# Patient Record
Sex: Female | Born: 1940 | Race: White | Hispanic: No | Marital: Married | Smoking: Never smoker
Health system: Southern US, Community
[De-identification: ages and names within clinical notes are randomized; demographics above are authoritative.]

## PROBLEM LIST (undated history)

## (undated) DIAGNOSIS — J45909 Unspecified asthma, uncomplicated: Secondary | ICD-10-CM

## (undated) HISTORY — DX: Unspecified asthma, uncomplicated: J45.909

## (undated) HISTORY — PX: OTHER SURGICAL HISTORY: SHX169

## (undated) HISTORY — PX: APPENDECTOMY: SHX54

## (undated) HISTORY — PX: MASTECTOMY: SHX3

---

## 1999-11-08 ENCOUNTER — Ambulatory Visit (HOSPITAL_COMMUNITY): Admission: RE | Admit: 1999-11-08 | Discharge: 1999-11-08 | Payer: Self-pay | Admitting: Gastroenterology

## 1999-11-14 ENCOUNTER — Other Ambulatory Visit: Admission: RE | Admit: 1999-11-14 | Discharge: 1999-11-14 | Payer: Self-pay | Admitting: General Surgery

## 1999-12-23 ENCOUNTER — Encounter: Payer: Self-pay | Admitting: General Surgery

## 1999-12-26 ENCOUNTER — Ambulatory Visit (HOSPITAL_COMMUNITY): Admission: RE | Admit: 1999-12-26 | Discharge: 1999-12-26 | Payer: Self-pay | Admitting: General Surgery

## 1999-12-26 ENCOUNTER — Encounter (INDEPENDENT_AMBULATORY_CARE_PROVIDER_SITE_OTHER): Payer: Self-pay | Admitting: Specialist

## 2001-11-02 ENCOUNTER — Other Ambulatory Visit: Admission: RE | Admit: 2001-11-02 | Discharge: 2001-11-02 | Payer: Self-pay | Admitting: Obstetrics and Gynecology

## 2007-04-22 ENCOUNTER — Ambulatory Visit: Payer: Self-pay | Admitting: Pulmonary Disease

## 2007-05-24 ENCOUNTER — Ambulatory Visit: Payer: Self-pay | Admitting: Cardiology

## 2007-06-01 ENCOUNTER — Ambulatory Visit: Payer: Self-pay | Admitting: Pulmonary Disease

## 2007-10-19 DIAGNOSIS — E785 Hyperlipidemia, unspecified: Secondary | ICD-10-CM

## 2007-10-19 DIAGNOSIS — C439 Malignant melanoma of skin, unspecified: Secondary | ICD-10-CM | POA: Insufficient documentation

## 2007-10-19 DIAGNOSIS — C50919 Malignant neoplasm of unspecified site of unspecified female breast: Secondary | ICD-10-CM | POA: Insufficient documentation

## 2007-10-20 ENCOUNTER — Ambulatory Visit: Payer: Self-pay | Admitting: Pulmonary Disease

## 2007-10-20 DIAGNOSIS — R930 Abnormal findings on diagnostic imaging of skull and head, not elsewhere classified: Secondary | ICD-10-CM

## 2007-10-20 DIAGNOSIS — J479 Bronchiectasis, uncomplicated: Secondary | ICD-10-CM

## 2007-10-20 DIAGNOSIS — J189 Pneumonia, unspecified organism: Secondary | ICD-10-CM

## 2011-02-18 NOTE — Assessment & Plan Note (Signed)
[facility]                             PULMONARY OFFICE NOTE   NAME:COVINGTONGenesia, Caslin                    MRN:          [id number]  DATE:04/22/2007                            DOB:          01/29/1942    PULMONARY CONSULTATION:  I met Ms. Odell today for evaluation of her  abnormal chest x-ray.  She says that on June 23 she developed flu-like  symptoms with chills and a sharp pain on the left side.  She had a blood  test done and was found to have an increase in her white blood cell  count.  As a result she was started on a Z-Pak but had persistent  symptoms.  She was then evaluated again with a chest x-ray on June 30  and was given a course of Biaxin for 10 days.  She says that since then  her symptoms have improved and she is no longer having any left-sided  chest pain.  Her chest x-ray from June 30 showed an infiltrate in the  left lower lobe as well as atelectasis in the left mid lung field and a  faint infiltrate on the right as well.  She had undergone CT scan of her  chest on June 30 as well and this showed again an infiltrate in the left  lower lobe as well as the left lingular lobe with atelectasis and a  faint infiltrate on the right as well as small nodular densities in the  bronchovascular distribution, more prominent in the lower lobes.  She  then had a follow-up chest x-ray done on July 8, which showed  significant improvement in her infiltrate although she still had some  atelectasis.  She says that she apparently does have an occasional  cough, which is dry.  She is not having any fevers, chills, sweats or  weight loss.  She denies any chest pain, chest tightness or wheezing.  She also denies any abdominal pain, nausea, vomiting, or diarrhea.  She  has not had any skin rashes or leg swelling.  She has not had any sick  exposures.  There is no significant travel history.  She owns 3 cats but  does not have any other significant  exposure to farm animals or birds.  There is no history of occupational exposures.  She does have a history  of previous breast cancer in 1987 as well as melanoma several years ago  without any evidence of recurrence since those times.   Her past medical history is significant for:  1. Asthma.  2. Elevated cholesterol.  3. Breast cancer.  4. Allergies.  5. Melanoma.   Past surgical history is significant for:  1. Tubal ligation.  2. Hysterectomy.  3. Bilateral mastectomy.  4. Appendectomy.   Her current medications are:  1. Zocor 20 mg daily.  2. Vagifem 50 mg once a week.   She has no known drug allergies.   SOCIAL HISTORY:  She is married.  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  She has no personal  history of smoking but  has significant second-hand tobacco exposure.  She denies alcohol use.   Her family history is significant for her sister, who had cancer.   On review of systems, she says she has lost approximately 5 pounds over  the last 6 months.   On physical exam, she is 5 feet 4 inches tall and 131 pounds.  Temperature is 98.8, blood pressure is 140/90, heart rate is 72, oxygen  saturation 99% on room air.  HEENT:  Pupils are reactive.  Extraocular muscles are intact.  There is  no sinus tenderness, no nasal discharge, no oral lesions.  No  lymphadenopathy, no thyromegaly.  Heart was S1-S2 with an occasional extra beat.  There are no murmurs.  CHEST:  There are faint crackles heard at the bases, more prominent on  the left, but there was no wheezing.  Abdomen was thin, soft, nontender.  Positive bowel sounds.  EXTREMITIES:  There was no edema, cyanosis or clubbing.  NEUROLOGIC:  No focal deficits were appreciated.   Chest x-ray shows atelectasis of the left lingular region, which appears  to have improved slightly compared to her x-ray from June 8 with  decreased appearance in the infiltrates.   IMPRESSION:  Possible  pneumonia with persistent infiltrate and faint  nodular densities on her CT scan of the chest from April 05, 2007.  She  currently has improved quite a bit symptomatically with her course of  antibiotics.  What I would like for her to is monitor her symptom status  and we will arrange for her to undergo a repeat CT scan of her chest in  approximately 4-6 weeks, at which time if there are persistent changes,  then she may need to undergo bronchoscopy with airway inspection and  tissue sampling.   I will follow up with her after I have a chance to review her chest CT.     Coralyn Helling, MD  Electronically Signed    VS/MedQ  DD: 04/22/2007  DT: 04/23/2007  Job #: [id number]   cc:   Kerry Kass, M.D. [facility]

## 2011-02-18 NOTE — Assessment & Plan Note (Signed)
[facility]                             PULMONARY OFFICE NOTE   NAME:COVINGTONGlorya, Bartley                    MRN:          [id number]  DATE:06/01/2007                            DOB:          01/29/1942    I saw Ms. Rhinesmith today for followup of her abnormal CT scan of the  chest with recent pneumonia.   She had undergone a repeat CT scan of her chest on May 24, 2007, and  this showed persistence of the bilateral diffuse nodularity, but without  significant change.  She also had right upper lobe, right middle lobe,  and lingular lobe bronchiectasis, but her left lower lobe infiltrate had  improved.  She currently denies any symptoms of coughing, sputum  production, or hemoptysis.  She also currently denies any symptoms of  fevers, sweats, chills, or weight loss.  She does not have any problems  with chest pains, nausea, vomiting, or diarrhea or abdominal pain.  She  has not had any recent skin rashes.  She also denies any sinus symptoms.  Her medication list was reviewed.   PHYSICAL EXAMINATION:  VITAL SIGNS:  She weighs 132 pounds, temperature  is 98.2, blood pressure is 138/86, heart rate is 70, oxygen saturation  is 97% on room air.  HEENT:  There is no sinus tenderness, no nasal discharge, no oral  lesions.  NECK:  No lymphadenopathy.  HEART:  S1, S2.  CHEST:  She had a prolonged expiratory phase, but no wheezing or rales.  ABDOMEN:  Soft, nontender.  EXTREMITIES:  There was no edema.   IMPRESSION:  1. Recent pneumonia, which has improved to both radiographically as      well as symptomatically.  She also has the nodularity on her CT.      Given the fact that she has improved so much symptomatically, I do      not think that I would recommend any further interventions at this      time.  What I would recommend is that she undergo repeat chest x-      ray in approximately 4 months to determine if any further      interventions would be  necessary then.  2. Bronchiectasis.  Again, she is not having much with regard to      symptoms with this, and I would monitor her clinically, and I will      reassess this at her next follow-up visit, at which time also I      would determine if it would be beneficial to have her undergo      pulmonary function tests to see if she may benefit from the use of      inhaler therapy as well.   I will follow up with her in approximately 4 months.    Coralyn Helling, MD  Electronically Signed   VS/MedQ  DD: 06/01/2007  DT: 06/02/2007  Job #: [id number]   cc:   Kerry Kass, M.D. [facility]

## 2012-01-09 DIAGNOSIS — J019 Acute sinusitis, unspecified: Secondary | ICD-10-CM | POA: Diagnosis not present

## 2012-01-09 DIAGNOSIS — J45909 Unspecified asthma, uncomplicated: Secondary | ICD-10-CM | POA: Diagnosis not present

## 2012-01-19 DIAGNOSIS — H04129 Dry eye syndrome of unspecified lacrimal gland: Secondary | ICD-10-CM | POA: Diagnosis not present

## 2012-01-28 DIAGNOSIS — Z85820 Personal history of malignant melanoma of skin: Secondary | ICD-10-CM | POA: Diagnosis not present

## 2012-01-28 DIAGNOSIS — D1801 Hemangioma of skin and subcutaneous tissue: Secondary | ICD-10-CM | POA: Diagnosis not present

## 2012-01-28 DIAGNOSIS — L820 Inflamed seborrheic keratosis: Secondary | ICD-10-CM | POA: Diagnosis not present

## 2012-01-28 DIAGNOSIS — D485 Neoplasm of uncertain behavior of skin: Secondary | ICD-10-CM | POA: Diagnosis not present

## 2012-01-28 DIAGNOSIS — L821 Other seborrheic keratosis: Secondary | ICD-10-CM | POA: Diagnosis not present

## 2012-01-29 DIAGNOSIS — J45909 Unspecified asthma, uncomplicated: Secondary | ICD-10-CM | POA: Diagnosis not present

## 2012-02-25 DIAGNOSIS — E782 Mixed hyperlipidemia: Secondary | ICD-10-CM | POA: Diagnosis not present

## 2012-02-25 DIAGNOSIS — Z79899 Other long term (current) drug therapy: Secondary | ICD-10-CM | POA: Diagnosis not present

## 2012-02-26 DIAGNOSIS — J45909 Unspecified asthma, uncomplicated: Secondary | ICD-10-CM | POA: Diagnosis not present

## 2012-03-03 DIAGNOSIS — N952 Postmenopausal atrophic vaginitis: Secondary | ICD-10-CM | POA: Diagnosis not present

## 2012-03-03 DIAGNOSIS — I10 Essential (primary) hypertension: Secondary | ICD-10-CM | POA: Diagnosis not present

## 2012-03-03 DIAGNOSIS — E782 Mixed hyperlipidemia: Secondary | ICD-10-CM | POA: Diagnosis not present

## 2012-03-17 DIAGNOSIS — M545 Low back pain: Secondary | ICD-10-CM | POA: Diagnosis not present

## 2012-03-23 DIAGNOSIS — M9981 Other biomechanical lesions of cervical region: Secondary | ICD-10-CM | POA: Diagnosis not present

## 2012-03-23 DIAGNOSIS — M999 Biomechanical lesion, unspecified: Secondary | ICD-10-CM | POA: Diagnosis not present

## 2012-03-29 DIAGNOSIS — M999 Biomechanical lesion, unspecified: Secondary | ICD-10-CM | POA: Diagnosis not present

## 2012-03-29 DIAGNOSIS — M9981 Other biomechanical lesions of cervical region: Secondary | ICD-10-CM | POA: Diagnosis not present

## 2012-03-31 DIAGNOSIS — M545 Low back pain, unspecified: Secondary | ICD-10-CM | POA: Diagnosis not present

## 2012-03-31 DIAGNOSIS — Z853 Personal history of malignant neoplasm of breast: Secondary | ICD-10-CM | POA: Diagnosis not present

## 2012-05-26 DIAGNOSIS — Z79899 Other long term (current) drug therapy: Secondary | ICD-10-CM | POA: Diagnosis not present

## 2012-05-27 DIAGNOSIS — J45909 Unspecified asthma, uncomplicated: Secondary | ICD-10-CM | POA: Diagnosis not present

## 2012-06-02 DIAGNOSIS — E782 Mixed hyperlipidemia: Secondary | ICD-10-CM | POA: Diagnosis not present

## 2012-07-07 DIAGNOSIS — Z23 Encounter for immunization: Secondary | ICD-10-CM | POA: Diagnosis not present

## 2012-07-19 DIAGNOSIS — J45901 Unspecified asthma with (acute) exacerbation: Secondary | ICD-10-CM | POA: Diagnosis not present

## 2012-07-27 DIAGNOSIS — N3090 Cystitis, unspecified without hematuria: Secondary | ICD-10-CM | POA: Diagnosis not present

## 2012-08-13 DIAGNOSIS — M9981 Other biomechanical lesions of cervical region: Secondary | ICD-10-CM | POA: Diagnosis not present

## 2012-08-13 DIAGNOSIS — M999 Biomechanical lesion, unspecified: Secondary | ICD-10-CM | POA: Diagnosis not present

## 2012-08-26 DIAGNOSIS — M9981 Other biomechanical lesions of cervical region: Secondary | ICD-10-CM | POA: Diagnosis not present

## 2012-08-26 DIAGNOSIS — M5126 Other intervertebral disc displacement, lumbar region: Secondary | ICD-10-CM | POA: Diagnosis not present

## 2012-08-26 DIAGNOSIS — M999 Biomechanical lesion, unspecified: Secondary | ICD-10-CM | POA: Diagnosis not present

## 2012-08-31 DIAGNOSIS — M9981 Other biomechanical lesions of cervical region: Secondary | ICD-10-CM | POA: Diagnosis not present

## 2012-08-31 DIAGNOSIS — M999 Biomechanical lesion, unspecified: Secondary | ICD-10-CM | POA: Diagnosis not present

## 2012-08-31 DIAGNOSIS — M5126 Other intervertebral disc displacement, lumbar region: Secondary | ICD-10-CM | POA: Diagnosis not present

## 2012-09-07 DIAGNOSIS — M9981 Other biomechanical lesions of cervical region: Secondary | ICD-10-CM | POA: Diagnosis not present

## 2012-09-07 DIAGNOSIS — M5126 Other intervertebral disc displacement, lumbar region: Secondary | ICD-10-CM | POA: Diagnosis not present

## 2012-09-07 DIAGNOSIS — M999 Biomechanical lesion, unspecified: Secondary | ICD-10-CM | POA: Diagnosis not present

## 2012-09-09 DIAGNOSIS — M9981 Other biomechanical lesions of cervical region: Secondary | ICD-10-CM | POA: Diagnosis not present

## 2012-09-09 DIAGNOSIS — M5126 Other intervertebral disc displacement, lumbar region: Secondary | ICD-10-CM | POA: Diagnosis not present

## 2012-09-09 DIAGNOSIS — M999 Biomechanical lesion, unspecified: Secondary | ICD-10-CM | POA: Diagnosis not present

## 2012-09-14 DIAGNOSIS — M9981 Other biomechanical lesions of cervical region: Secondary | ICD-10-CM | POA: Diagnosis not present

## 2012-09-14 DIAGNOSIS — M5126 Other intervertebral disc displacement, lumbar region: Secondary | ICD-10-CM | POA: Diagnosis not present

## 2012-09-14 DIAGNOSIS — M999 Biomechanical lesion, unspecified: Secondary | ICD-10-CM | POA: Diagnosis not present

## 2012-12-02 DIAGNOSIS — J45909 Unspecified asthma, uncomplicated: Secondary | ICD-10-CM | POA: Diagnosis not present

## 2012-12-22 DIAGNOSIS — Z79899 Other long term (current) drug therapy: Secondary | ICD-10-CM | POA: Diagnosis not present

## 2012-12-22 DIAGNOSIS — E782 Mixed hyperlipidemia: Secondary | ICD-10-CM | POA: Diagnosis not present

## 2012-12-29 DIAGNOSIS — J309 Allergic rhinitis, unspecified: Secondary | ICD-10-CM | POA: Diagnosis not present

## 2012-12-29 DIAGNOSIS — J45909 Unspecified asthma, uncomplicated: Secondary | ICD-10-CM | POA: Diagnosis not present

## 2012-12-30 DIAGNOSIS — I10 Essential (primary) hypertension: Secondary | ICD-10-CM | POA: Diagnosis not present

## 2013-01-31 DIAGNOSIS — Z85820 Personal history of malignant melanoma of skin: Secondary | ICD-10-CM | POA: Diagnosis not present

## 2013-01-31 DIAGNOSIS — D1801 Hemangioma of skin and subcutaneous tissue: Secondary | ICD-10-CM | POA: Diagnosis not present

## 2013-01-31 DIAGNOSIS — L821 Other seborrheic keratosis: Secondary | ICD-10-CM | POA: Diagnosis not present

## 2013-01-31 DIAGNOSIS — D239 Other benign neoplasm of skin, unspecified: Secondary | ICD-10-CM | POA: Diagnosis not present

## 2013-02-24 DIAGNOSIS — M999 Biomechanical lesion, unspecified: Secondary | ICD-10-CM | POA: Diagnosis not present

## 2013-02-24 DIAGNOSIS — M9981 Other biomechanical lesions of cervical region: Secondary | ICD-10-CM | POA: Diagnosis not present

## 2013-02-24 DIAGNOSIS — M5126 Other intervertebral disc displacement, lumbar region: Secondary | ICD-10-CM | POA: Diagnosis not present

## 2013-03-29 DIAGNOSIS — R05 Cough: Secondary | ICD-10-CM | POA: Diagnosis not present

## 2013-03-29 DIAGNOSIS — Z86010 Personal history of colonic polyps: Secondary | ICD-10-CM | POA: Diagnosis not present

## 2013-06-01 DIAGNOSIS — R059 Cough, unspecified: Secondary | ICD-10-CM | POA: Diagnosis not present

## 2013-06-01 DIAGNOSIS — R918 Other nonspecific abnormal finding of lung field: Secondary | ICD-10-CM | POA: Diagnosis not present

## 2013-06-01 DIAGNOSIS — R05 Cough: Secondary | ICD-10-CM | POA: Diagnosis not present

## 2013-06-01 DIAGNOSIS — J45901 Unspecified asthma with (acute) exacerbation: Secondary | ICD-10-CM | POA: Diagnosis not present

## 2013-06-07 DIAGNOSIS — R1310 Dysphagia, unspecified: Secondary | ICD-10-CM | POA: Diagnosis not present

## 2013-06-07 DIAGNOSIS — K449 Diaphragmatic hernia without obstruction or gangrene: Secondary | ICD-10-CM | POA: Diagnosis not present

## 2013-06-07 DIAGNOSIS — K224 Dyskinesia of esophagus: Secondary | ICD-10-CM | POA: Diagnosis not present

## 2013-06-09 DIAGNOSIS — R1310 Dysphagia, unspecified: Secondary | ICD-10-CM | POA: Diagnosis not present

## 2013-06-09 DIAGNOSIS — R5381 Other malaise: Secondary | ICD-10-CM | POA: Diagnosis not present

## 2013-06-16 DIAGNOSIS — Z23 Encounter for immunization: Secondary | ICD-10-CM | POA: Diagnosis not present

## 2013-06-17 DIAGNOSIS — B351 Tinea unguium: Secondary | ICD-10-CM | POA: Diagnosis not present

## 2013-07-27 DIAGNOSIS — R1310 Dysphagia, unspecified: Secondary | ICD-10-CM | POA: Diagnosis not present

## 2013-07-27 DIAGNOSIS — K224 Dyskinesia of esophagus: Secondary | ICD-10-CM | POA: Diagnosis not present

## 2013-07-29 DIAGNOSIS — B351 Tinea unguium: Secondary | ICD-10-CM | POA: Diagnosis not present

## 2013-08-01 DIAGNOSIS — J309 Allergic rhinitis, unspecified: Secondary | ICD-10-CM | POA: Diagnosis not present

## 2013-08-01 DIAGNOSIS — J45909 Unspecified asthma, uncomplicated: Secondary | ICD-10-CM | POA: Diagnosis not present

## 2013-10-12 DIAGNOSIS — E782 Mixed hyperlipidemia: Secondary | ICD-10-CM | POA: Diagnosis not present

## 2013-10-12 DIAGNOSIS — I10 Essential (primary) hypertension: Secondary | ICD-10-CM | POA: Diagnosis not present

## 2013-10-18 DIAGNOSIS — E782 Mixed hyperlipidemia: Secondary | ICD-10-CM | POA: Diagnosis not present

## 2013-10-18 DIAGNOSIS — N952 Postmenopausal atrophic vaginitis: Secondary | ICD-10-CM | POA: Diagnosis not present

## 2013-10-18 DIAGNOSIS — I10 Essential (primary) hypertension: Secondary | ICD-10-CM | POA: Diagnosis not present

## 2013-12-13 DIAGNOSIS — J309 Allergic rhinitis, unspecified: Secondary | ICD-10-CM | POA: Diagnosis not present

## 2014-01-17 DIAGNOSIS — R05 Cough: Secondary | ICD-10-CM | POA: Diagnosis not present

## 2014-01-17 DIAGNOSIS — H9319 Tinnitus, unspecified ear: Secondary | ICD-10-CM | POA: Diagnosis not present

## 2014-01-17 DIAGNOSIS — R059 Cough, unspecified: Secondary | ICD-10-CM | POA: Diagnosis not present

## 2014-01-17 DIAGNOSIS — K219 Gastro-esophageal reflux disease without esophagitis: Secondary | ICD-10-CM | POA: Diagnosis not present

## 2014-01-17 DIAGNOSIS — J309 Allergic rhinitis, unspecified: Secondary | ICD-10-CM | POA: Diagnosis not present

## 2014-01-30 DIAGNOSIS — L821 Other seborrheic keratosis: Secondary | ICD-10-CM | POA: Diagnosis not present

## 2014-01-30 DIAGNOSIS — D239 Other benign neoplasm of skin, unspecified: Secondary | ICD-10-CM | POA: Diagnosis not present

## 2014-01-30 DIAGNOSIS — Z85820 Personal history of malignant melanoma of skin: Secondary | ICD-10-CM | POA: Diagnosis not present

## 2014-01-30 DIAGNOSIS — D1801 Hemangioma of skin and subcutaneous tissue: Secondary | ICD-10-CM | POA: Diagnosis not present

## 2014-03-20 DIAGNOSIS — J309 Allergic rhinitis, unspecified: Secondary | ICD-10-CM | POA: Diagnosis not present

## 2014-03-20 DIAGNOSIS — H9319 Tinnitus, unspecified ear: Secondary | ICD-10-CM | POA: Diagnosis not present

## 2014-03-20 DIAGNOSIS — K219 Gastro-esophageal reflux disease without esophagitis: Secondary | ICD-10-CM | POA: Diagnosis not present

## 2014-03-23 DIAGNOSIS — H40019 Open angle with borderline findings, low risk, unspecified eye: Secondary | ICD-10-CM | POA: Diagnosis not present

## 2014-03-23 DIAGNOSIS — H04129 Dry eye syndrome of unspecified lacrimal gland: Secondary | ICD-10-CM | POA: Diagnosis not present

## 2014-04-11 DIAGNOSIS — Z79899 Other long term (current) drug therapy: Secondary | ICD-10-CM | POA: Diagnosis not present

## 2014-04-11 DIAGNOSIS — E782 Mixed hyperlipidemia: Secondary | ICD-10-CM | POA: Diagnosis not present

## 2014-04-13 DIAGNOSIS — Z86010 Personal history of colonic polyps: Secondary | ICD-10-CM | POA: Diagnosis not present

## 2014-04-13 DIAGNOSIS — Z09 Encounter for follow-up examination after completed treatment for conditions other than malignant neoplasm: Secondary | ICD-10-CM | POA: Diagnosis not present

## 2014-04-13 DIAGNOSIS — K5730 Diverticulosis of large intestine without perforation or abscess without bleeding: Secondary | ICD-10-CM | POA: Diagnosis not present

## 2014-04-13 DIAGNOSIS — K648 Other hemorrhoids: Secondary | ICD-10-CM | POA: Diagnosis not present

## 2014-04-17 DIAGNOSIS — I10 Essential (primary) hypertension: Secondary | ICD-10-CM | POA: Diagnosis not present

## 2014-04-17 DIAGNOSIS — Z0000 Encounter for general adult medical examination without abnormal findings: Secondary | ICD-10-CM | POA: Diagnosis not present

## 2014-04-17 DIAGNOSIS — N952 Postmenopausal atrophic vaginitis: Secondary | ICD-10-CM | POA: Diagnosis not present

## 2014-05-09 DIAGNOSIS — R0789 Other chest pain: Secondary | ICD-10-CM | POA: Diagnosis not present

## 2014-05-15 DIAGNOSIS — J45909 Unspecified asthma, uncomplicated: Secondary | ICD-10-CM | POA: Diagnosis not present

## 2014-05-31 DIAGNOSIS — R9389 Abnormal findings on diagnostic imaging of other specified body structures: Secondary | ICD-10-CM | POA: Diagnosis not present

## 2014-05-31 DIAGNOSIS — C50919 Malignant neoplasm of unspecified site of unspecified female breast: Secondary | ICD-10-CM | POA: Diagnosis not present

## 2014-05-31 DIAGNOSIS — Z9010 Acquired absence of unspecified breast and nipple: Secondary | ICD-10-CM | POA: Diagnosis not present

## 2014-05-31 DIAGNOSIS — J984 Other disorders of lung: Secondary | ICD-10-CM | POA: Diagnosis not present

## 2014-05-31 DIAGNOSIS — Z853 Personal history of malignant neoplasm of breast: Secondary | ICD-10-CM | POA: Diagnosis not present

## 2014-06-15 DIAGNOSIS — Z23 Encounter for immunization: Secondary | ICD-10-CM | POA: Diagnosis not present

## 2014-08-24 DIAGNOSIS — M9902 Segmental and somatic dysfunction of thoracic region: Secondary | ICD-10-CM | POA: Diagnosis not present

## 2014-08-24 DIAGNOSIS — M9905 Segmental and somatic dysfunction of pelvic region: Secondary | ICD-10-CM | POA: Diagnosis not present

## 2014-08-24 DIAGNOSIS — M5416 Radiculopathy, lumbar region: Secondary | ICD-10-CM | POA: Diagnosis not present

## 2014-08-24 DIAGNOSIS — M9903 Segmental and somatic dysfunction of lumbar region: Secondary | ICD-10-CM | POA: Diagnosis not present

## 2014-08-29 DIAGNOSIS — M9902 Segmental and somatic dysfunction of thoracic region: Secondary | ICD-10-CM | POA: Diagnosis not present

## 2014-08-29 DIAGNOSIS — M9905 Segmental and somatic dysfunction of pelvic region: Secondary | ICD-10-CM | POA: Diagnosis not present

## 2014-08-29 DIAGNOSIS — M5416 Radiculopathy, lumbar region: Secondary | ICD-10-CM | POA: Diagnosis not present

## 2014-08-29 DIAGNOSIS — M9903 Segmental and somatic dysfunction of lumbar region: Secondary | ICD-10-CM | POA: Diagnosis not present

## 2014-10-11 DIAGNOSIS — E782 Mixed hyperlipidemia: Secondary | ICD-10-CM | POA: Diagnosis not present

## 2014-10-11 DIAGNOSIS — Z79899 Other long term (current) drug therapy: Secondary | ICD-10-CM | POA: Diagnosis not present

## 2014-10-18 DIAGNOSIS — I10 Essential (primary) hypertension: Secondary | ICD-10-CM | POA: Diagnosis not present

## 2014-10-18 DIAGNOSIS — Z23 Encounter for immunization: Secondary | ICD-10-CM | POA: Diagnosis not present

## 2014-10-18 DIAGNOSIS — E782 Mixed hyperlipidemia: Secondary | ICD-10-CM | POA: Diagnosis not present

## 2014-11-08 DIAGNOSIS — J309 Allergic rhinitis, unspecified: Secondary | ICD-10-CM | POA: Diagnosis not present

## 2014-11-08 DIAGNOSIS — J45901 Unspecified asthma with (acute) exacerbation: Secondary | ICD-10-CM | POA: Diagnosis not present

## 2014-11-26 DIAGNOSIS — K828 Other specified diseases of gallbladder: Secondary | ICD-10-CM | POA: Diagnosis not present

## 2014-11-26 DIAGNOSIS — N200 Calculus of kidney: Secondary | ICD-10-CM | POA: Diagnosis not present

## 2014-11-26 DIAGNOSIS — R1032 Left lower quadrant pain: Secondary | ICD-10-CM | POA: Diagnosis not present

## 2014-11-26 DIAGNOSIS — K529 Noninfective gastroenteritis and colitis, unspecified: Secondary | ICD-10-CM | POA: Diagnosis not present

## 2014-11-29 DIAGNOSIS — K5732 Diverticulitis of large intestine without perforation or abscess without bleeding: Secondary | ICD-10-CM | POA: Diagnosis not present

## 2015-02-20 DIAGNOSIS — D2272 Melanocytic nevi of left lower limb, including hip: Secondary | ICD-10-CM | POA: Diagnosis not present

## 2015-02-20 DIAGNOSIS — D1801 Hemangioma of skin and subcutaneous tissue: Secondary | ICD-10-CM | POA: Diagnosis not present

## 2015-02-20 DIAGNOSIS — D2371 Other benign neoplasm of skin of right lower limb, including hip: Secondary | ICD-10-CM | POA: Diagnosis not present

## 2015-02-20 DIAGNOSIS — L821 Other seborrheic keratosis: Secondary | ICD-10-CM | POA: Diagnosis not present

## 2015-02-20 DIAGNOSIS — D485 Neoplasm of uncertain behavior of skin: Secondary | ICD-10-CM | POA: Diagnosis not present

## 2015-02-20 DIAGNOSIS — D225 Melanocytic nevi of trunk: Secondary | ICD-10-CM | POA: Diagnosis not present

## 2015-02-20 DIAGNOSIS — Z85820 Personal history of malignant melanoma of skin: Secondary | ICD-10-CM | POA: Diagnosis not present

## 2015-04-24 DIAGNOSIS — H43813 Vitreous degeneration, bilateral: Secondary | ICD-10-CM | POA: Diagnosis not present

## 2015-04-24 DIAGNOSIS — H31091 Other chorioretinal scars, right eye: Secondary | ICD-10-CM | POA: Diagnosis not present

## 2015-04-25 DIAGNOSIS — Z79899 Other long term (current) drug therapy: Secondary | ICD-10-CM | POA: Diagnosis not present

## 2015-04-25 DIAGNOSIS — E559 Vitamin D deficiency, unspecified: Secondary | ICD-10-CM | POA: Diagnosis not present

## 2015-04-25 DIAGNOSIS — E782 Mixed hyperlipidemia: Secondary | ICD-10-CM | POA: Diagnosis not present

## 2015-05-01 DIAGNOSIS — E782 Mixed hyperlipidemia: Secondary | ICD-10-CM | POA: Diagnosis not present

## 2015-05-01 DIAGNOSIS — Z0000 Encounter for general adult medical examination without abnormal findings: Secondary | ICD-10-CM | POA: Diagnosis not present

## 2015-05-01 DIAGNOSIS — M8580 Other specified disorders of bone density and structure, unspecified site: Secondary | ICD-10-CM | POA: Diagnosis not present

## 2015-05-01 DIAGNOSIS — M859 Disorder of bone density and structure, unspecified: Secondary | ICD-10-CM | POA: Diagnosis not present

## 2015-05-01 DIAGNOSIS — I10 Essential (primary) hypertension: Secondary | ICD-10-CM | POA: Diagnosis not present

## 2015-05-01 DIAGNOSIS — Z13820 Encounter for screening for osteoporosis: Secondary | ICD-10-CM | POA: Diagnosis not present

## 2015-06-27 DIAGNOSIS — K219 Gastro-esophageal reflux disease without esophagitis: Secondary | ICD-10-CM

## 2015-06-27 DIAGNOSIS — J3089 Other allergic rhinitis: Secondary | ICD-10-CM | POA: Insufficient documentation

## 2015-06-27 DIAGNOSIS — J45909 Unspecified asthma, uncomplicated: Secondary | ICD-10-CM | POA: Insufficient documentation

## 2015-06-27 DIAGNOSIS — K224 Dyskinesia of esophagus: Secondary | ICD-10-CM | POA: Insufficient documentation

## 2015-07-03 DIAGNOSIS — Z23 Encounter for immunization: Secondary | ICD-10-CM | POA: Diagnosis not present

## 2015-08-16 ENCOUNTER — Encounter: Payer: Self-pay | Admitting: Allergy and Immunology

## 2015-08-16 ENCOUNTER — Ambulatory Visit (INDEPENDENT_AMBULATORY_CARE_PROVIDER_SITE_OTHER): Payer: Medicare Other | Admitting: Allergy and Immunology

## 2015-08-16 VITALS — BP 138/90 | HR 76 | Resp 20 | Ht 63.0 in | Wt 118.0 lb

## 2015-08-16 DIAGNOSIS — J4540 Moderate persistent asthma, uncomplicated: Secondary | ICD-10-CM | POA: Diagnosis not present

## 2015-08-16 DIAGNOSIS — J3089 Other allergic rhinitis: Secondary | ICD-10-CM

## 2015-08-16 MED ORDER — FLUTICASONE PROPIONATE 50 MCG/ACT NA SUSP: 2.0000 | Freq: Every day | NASAL | Status: DC

## 2015-08-16 MED ORDER — MOMETASONE FURO-FORMOTEROL FUM 200-5 MCG/ACT IN AERO: 2.0000 | INHALATION_SPRAY | Freq: Two times a day (BID) | RESPIRATORY_TRACT | Status: DC

## 2015-08-16 MED ORDER — IPRATROPIUM-ALBUTEROL 0.5-2.5 (3) MG/3ML IN SOLN: 3.0000 mL | Freq: Four times a day (QID) | RESPIRATORY_TRACT | Status: DC

## 2015-08-16 NOTE — Assessment & Plan Note (Signed)
   A prescription has been provided for fluticasone nasal spray, 2 sprays per nostril daily as needed. Proper nasal spray technique has been discussed and demonstrated.  Nasal saline lavage as needed has been recommended along with instructions for proper administration.

## 2015-08-16 NOTE — Assessment & Plan Note (Addendum)
   Continue Dulera 200/5 g, 2 inhalations via spacer device twice a day.  A refill prescription has been provided.  Continue albuterol HFA, 1-2 inhalations every 4-6 hours as needed.  In addition, I have encouraged Kristy [name] to use albuterol HFA 2 inhalations 15 minutes prior to exercise.  Subjective and objective measures of pulmonary function will be followed and the treatment plan will be adjusted accordingly.

## 2015-08-16 NOTE — Progress Notes (Signed)
History of present illness: HPI Comments: Kristy [name] is a 74 y.o. female with persistent asthma, restrictive lung disease, allergic rhinitis, and laryngopharyngeal reflux.  She reports that her upper and lower rest or symptoms have been well-controlled in the interval since her previous visit in February, however she needs a refill on Dulera 200.  She states that she only experiences lower respiratory symptoms with exercise but does not use albuterol prior to exercise.  She has no reflux related complaints today.  She has no nasal symptom complaints other than occasional thick postnasal drainage.   Assessment and plan:  Moderate persistent asthma  Continue Dulera 200/5 g, 2 inhalations via spacer device twice a day.  A refill prescription has been provided.  Continue albuterol HFA, 1-2 inhalations every 4-6 hours as needed.  In addition, I have encouraged Kristy [name] to use albuterol HFA 2 inhalations 15 minutes prior to exercise.  Subjective and objective measures of pulmonary function will be followed and the treatment plan will be adjusted accordingly.  Allergic rhinitis  A prescription has been provided for fluticasone nasal spray, 2 sprays per nostril daily as needed. Proper nasal spray technique has been discussed and demonstrated.  Nasal saline lavage as needed has been recommended along with instructions for proper administration.   Medications ordered this encounter: Meds ordered this encounter  Medications  . mometasone-formoterol (DULERA) 200-5 MCG/ACT AERO    Sig: Inhale 2 puffs into the lungs 2 (two) times daily.    Dispense:  1 Inhaler    Refill:  5    Diagnositics: Spirometry: Mixed restrictive/obstructive pattern with significant post bronchodilator improvement.  Please see scanned spirometry results for details.    Physical examination: Blood pressure 138/90, pulse 76, resp. rate 20, height 5\' 3"  (1.6 m), weight 118 lb (53.524 kg).  General: Alert, interactive,  in no acute distress. HEENT: TMs pearly gray, turbinates mildly edematous without discharge, post-pharynx mildly erythematous. Neck: Supple without lymphadenopathy. Lungs: Clear to auscultation without wheezing, rhonchi or rales. CV: Normal S1, S2 without murmurs. Skin: Warm and dry, without lesions or rashes.  The following portions of the patient's history were reviewed and updated as appropriate: allergies, current medications, past family history, past medical history, past social history, past surgical history and problem list.  Outpatient medications:   Medication List       This list is accurate as of: 08/16/15  1:22 PM.  Always use your most recent med list.               albuterol 108 (90 BASE) MCG/ACT inhaler  Commonly known as:  PROVENTIL HFA;VENTOLIN HFA  Inhale 2 puffs into the lungs every 6 (six) hours as needed for wheezing or shortness of breath.     B COMPLEX PO  Take by mouth daily.     beclomethasone 80 MCG/ACT inhaler  Commonly known as:  QVAR  Inhale 2 puffs into the lungs 3 (three) times daily.     BIOTIN PO  Take by mouth daily.     CALCIUM PO  Take by mouth daily.     ezetimibe 10 MG tablet  Commonly known as:  ZETIA  Take 10 mg by mouth daily.     losartan 50 MG tablet  Commonly known as:  COZAAR  Take 50 mg by mouth daily.     MAGNESIUM PO  Take by mouth daily.     metoprolol succinate 25 MG 24 hr tablet  Commonly known as:  TOPROL-XL  Take 25 mg by mouth  daily.     mometasone-formoterol 200-5 MCG/ACT Aero  Commonly known as:  DULERA  Inhale 2 puffs into the lungs 2 (two) times daily.     VAGIFEM 10 MCG Tabs vaginal tablet  Generic drug:  Estradiol  Place 10 mcg vaginally 2 (two) times a week.     VITAMIN D PO  Take by mouth daily.        Known medication allergies: Allergies  Allergen Reactions  . Avelox [Moxifloxacin Hcl In Nacl]   . Ciprofloxacin   . Floxin [Ofloxacin]   . Iohexol      Code: HIVES, Desc: PER RHONDA  Warren @ DR SOOD OFFICE, 05/04/07 (PT IS ALLERGIC TO CONTRAST) CHANGE TO W/O CM. RM, Onset Date: [id number]   . Levaquin [Levofloxacin In D5w]   . Maxaquin [Lomefloxacin]   . Noroxin [Norfloxacin]   . Quinolones   . Tequin [Gatifloxacin]   . Trovan [Alatrofloxacin]   . Zagam [Sparfloxacin]     I appreciate the opportunity to take part in this Kristy [name]'s care. Please do not hesitate to contact me with questions.  Sincerely,   R. Edgar Frisk, MD

## 2015-08-16 NOTE — Patient Instructions (Signed)
Allergic rhinitis  A prescription has been provided for fluticasone nasal spray, 2 sprays per nostril daily as needed. Proper nasal spray technique has been discussed and demonstrated.  Nasal saline lavage as needed has been recommended along with instructions for proper administration.    Moderate persistent asthma  Continue Dulera 200/5 g, 2 inhalations via spacer device twice a day.  A refill prescription has been provided.  Continue albuterol HFA, 1-2 inhalations every 4-6 hours as needed.  In addition, I have encouraged Kristy [name] to use albuterol HFA 2 inhalations 15 minutes prior to exercise.  Subjective and objective measures of pulmonary function will be followed and the treatment plan will be adjusted accordingly.   Return in about 4 months (around 12/14/2015), or if symptoms worsen or fail to improve.

## 2015-10-23 DIAGNOSIS — E782 Mixed hyperlipidemia: Secondary | ICD-10-CM | POA: Diagnosis not present

## 2015-10-23 DIAGNOSIS — Z79899 Other long term (current) drug therapy: Secondary | ICD-10-CM | POA: Diagnosis not present

## 2015-10-31 DIAGNOSIS — I10 Essential (primary) hypertension: Secondary | ICD-10-CM | POA: Diagnosis not present

## 2015-10-31 DIAGNOSIS — N952 Postmenopausal atrophic vaginitis: Secondary | ICD-10-CM | POA: Diagnosis not present

## 2015-10-31 DIAGNOSIS — E782 Mixed hyperlipidemia: Secondary | ICD-10-CM | POA: Diagnosis not present

## 2016-03-17 DIAGNOSIS — R350 Frequency of micturition: Secondary | ICD-10-CM | POA: Diagnosis not present

## 2016-03-17 DIAGNOSIS — K5732 Diverticulitis of large intestine without perforation or abscess without bleeding: Secondary | ICD-10-CM | POA: Diagnosis not present

## 2016-04-29 DIAGNOSIS — H43813 Vitreous degeneration, bilateral: Secondary | ICD-10-CM | POA: Diagnosis not present

## 2016-04-29 DIAGNOSIS — H31091 Other chorioretinal scars, right eye: Secondary | ICD-10-CM | POA: Diagnosis not present

## 2016-05-05 DIAGNOSIS — Z85820 Personal history of malignant melanoma of skin: Secondary | ICD-10-CM | POA: Diagnosis not present

## 2016-05-05 DIAGNOSIS — D225 Melanocytic nevi of trunk: Secondary | ICD-10-CM | POA: Diagnosis not present

## 2016-05-05 DIAGNOSIS — D1801 Hemangioma of skin and subcutaneous tissue: Secondary | ICD-10-CM | POA: Diagnosis not present

## 2016-05-05 DIAGNOSIS — L821 Other seborrheic keratosis: Secondary | ICD-10-CM | POA: Diagnosis not present

## 2016-05-05 DIAGNOSIS — D485 Neoplasm of uncertain behavior of skin: Secondary | ICD-10-CM | POA: Diagnosis not present

## 2016-05-20 DIAGNOSIS — E782 Mixed hyperlipidemia: Secondary | ICD-10-CM | POA: Diagnosis not present

## 2016-05-20 DIAGNOSIS — Z79899 Other long term (current) drug therapy: Secondary | ICD-10-CM | POA: Diagnosis not present

## 2016-05-29 DIAGNOSIS — E782 Mixed hyperlipidemia: Secondary | ICD-10-CM | POA: Diagnosis not present

## 2016-05-29 DIAGNOSIS — Z9181 History of falling: Secondary | ICD-10-CM | POA: Diagnosis not present

## 2016-05-29 DIAGNOSIS — Z1389 Encounter for screening for other disorder: Secondary | ICD-10-CM | POA: Diagnosis not present

## 2016-05-29 DIAGNOSIS — J4530 Mild persistent asthma, uncomplicated: Secondary | ICD-10-CM | POA: Diagnosis not present

## 2016-05-29 DIAGNOSIS — Z0000 Encounter for general adult medical examination without abnormal findings: Secondary | ICD-10-CM | POA: Diagnosis not present

## 2016-06-30 DIAGNOSIS — J45901 Unspecified asthma with (acute) exacerbation: Secondary | ICD-10-CM | POA: Diagnosis not present

## 2016-07-02 DIAGNOSIS — J45901 Unspecified asthma with (acute) exacerbation: Secondary | ICD-10-CM | POA: Diagnosis not present

## 2016-10-08 DIAGNOSIS — J45901 Unspecified asthma with (acute) exacerbation: Secondary | ICD-10-CM | POA: Diagnosis not present

## 2016-10-20 DIAGNOSIS — J22 Unspecified acute lower respiratory infection: Secondary | ICD-10-CM | POA: Diagnosis not present

## 2016-10-24 ENCOUNTER — Ambulatory Visit: Payer: Medicare Other | Admitting: Allergy and Immunology

## 2016-10-31 DIAGNOSIS — R0600 Dyspnea, unspecified: Secondary | ICD-10-CM | POA: Diagnosis not present

## 2016-10-31 DIAGNOSIS — J45909 Unspecified asthma, uncomplicated: Secondary | ICD-10-CM | POA: Diagnosis not present

## 2016-10-31 DIAGNOSIS — R0602 Shortness of breath: Secondary | ICD-10-CM | POA: Diagnosis not present

## 2016-10-31 DIAGNOSIS — I2699 Other pulmonary embolism without acute cor pulmonale: Secondary | ICD-10-CM | POA: Diagnosis not present

## 2016-10-31 DIAGNOSIS — E875 Hyperkalemia: Secondary | ICD-10-CM | POA: Diagnosis not present

## 2016-10-31 DIAGNOSIS — I509 Heart failure, unspecified: Secondary | ICD-10-CM | POA: Diagnosis not present

## 2016-10-31 DIAGNOSIS — R05 Cough: Secondary | ICD-10-CM | POA: Diagnosis not present

## 2016-11-01 DIAGNOSIS — R0602 Shortness of breath: Secondary | ICD-10-CM | POA: Diagnosis not present

## 2016-11-01 DIAGNOSIS — I10 Essential (primary) hypertension: Secondary | ICD-10-CM | POA: Diagnosis present

## 2016-11-01 DIAGNOSIS — J45909 Unspecified asthma, uncomplicated: Secondary | ICD-10-CM | POA: Diagnosis not present

## 2016-11-01 DIAGNOSIS — Z79899 Other long term (current) drug therapy: Secondary | ICD-10-CM | POA: Diagnosis not present

## 2016-11-01 DIAGNOSIS — I2699 Other pulmonary embolism without acute cor pulmonale: Secondary | ICD-10-CM | POA: Diagnosis not present

## 2016-11-01 DIAGNOSIS — E7800 Pure hypercholesterolemia, unspecified: Secondary | ICD-10-CM | POA: Diagnosis present

## 2016-11-01 DIAGNOSIS — A310 Pulmonary mycobacterial infection: Secondary | ICD-10-CM | POA: Diagnosis not present

## 2016-11-01 DIAGNOSIS — E875 Hyperkalemia: Secondary | ICD-10-CM | POA: Diagnosis not present

## 2016-11-01 DIAGNOSIS — J479 Bronchiectasis, uncomplicated: Secondary | ICD-10-CM | POA: Diagnosis present

## 2016-11-01 DIAGNOSIS — E785 Hyperlipidemia, unspecified: Secondary | ICD-10-CM | POA: Diagnosis not present

## 2016-11-01 DIAGNOSIS — R0600 Dyspnea, unspecified: Secondary | ICD-10-CM | POA: Diagnosis not present

## 2016-11-01 DIAGNOSIS — Z7952 Long term (current) use of systemic steroids: Secondary | ICD-10-CM | POA: Diagnosis not present

## 2016-11-01 DIAGNOSIS — R05 Cough: Secondary | ICD-10-CM | POA: Diagnosis not present

## 2016-11-05 DIAGNOSIS — J4540 Moderate persistent asthma, uncomplicated: Secondary | ICD-10-CM | POA: Diagnosis not present

## 2016-11-05 DIAGNOSIS — R5383 Other fatigue: Secondary | ICD-10-CM | POA: Diagnosis not present

## 2016-11-05 DIAGNOSIS — J8489 Other specified interstitial pulmonary diseases: Secondary | ICD-10-CM | POA: Diagnosis not present

## 2016-11-05 DIAGNOSIS — J479 Bronchiectasis, uncomplicated: Secondary | ICD-10-CM | POA: Diagnosis not present

## 2016-11-06 ENCOUNTER — Ambulatory Visit: Payer: Medicare Other | Admitting: Allergy and Immunology

## 2016-11-07 DIAGNOSIS — A319 Mycobacterial infection, unspecified: Secondary | ICD-10-CM | POA: Diagnosis not present

## 2016-11-07 DIAGNOSIS — R197 Diarrhea, unspecified: Secondary | ICD-10-CM | POA: Diagnosis not present

## 2016-11-25 DIAGNOSIS — J4540 Moderate persistent asthma, uncomplicated: Secondary | ICD-10-CM | POA: Diagnosis not present

## 2016-11-25 DIAGNOSIS — J479 Bronchiectasis, uncomplicated: Secondary | ICD-10-CM | POA: Diagnosis not present

## 2016-11-25 DIAGNOSIS — J8489 Other specified interstitial pulmonary diseases: Secondary | ICD-10-CM | POA: Diagnosis not present

## 2017-01-06 DIAGNOSIS — J479 Bronchiectasis, uncomplicated: Secondary | ICD-10-CM | POA: Diagnosis not present

## 2017-01-06 DIAGNOSIS — J4540 Moderate persistent asthma, uncomplicated: Secondary | ICD-10-CM | POA: Diagnosis not present

## 2017-01-14 DIAGNOSIS — E785 Hyperlipidemia, unspecified: Secondary | ICD-10-CM | POA: Diagnosis not present

## 2017-01-14 DIAGNOSIS — Z79899 Other long term (current) drug therapy: Secondary | ICD-10-CM | POA: Diagnosis not present

## 2017-01-21 DIAGNOSIS — I10 Essential (primary) hypertension: Secondary | ICD-10-CM | POA: Diagnosis not present

## 2017-01-21 DIAGNOSIS — Z6820 Body mass index (BMI) 20.0-20.9, adult: Secondary | ICD-10-CM | POA: Diagnosis not present

## 2017-01-21 DIAGNOSIS — A319 Mycobacterial infection, unspecified: Secondary | ICD-10-CM | POA: Diagnosis not present

## 2017-01-21 DIAGNOSIS — J4530 Mild persistent asthma, uncomplicated: Secondary | ICD-10-CM | POA: Diagnosis not present

## 2017-02-03 ENCOUNTER — Institutional Professional Consult (permissible substitution): Payer: Medicare Other | Admitting: Pulmonary Disease

## 2017-02-27 ENCOUNTER — Other Ambulatory Visit (INDEPENDENT_AMBULATORY_CARE_PROVIDER_SITE_OTHER): Payer: Medicare Other

## 2017-02-27 ENCOUNTER — Ambulatory Visit (INDEPENDENT_AMBULATORY_CARE_PROVIDER_SITE_OTHER): Payer: Medicare Other | Admitting: Internal Medicine

## 2017-02-27 ENCOUNTER — Encounter: Payer: Self-pay | Admitting: Internal Medicine

## 2017-02-27 VITALS — BP 138/82 | HR 72 | Ht 63.75 in | Wt 119.0 lb

## 2017-02-27 DIAGNOSIS — J479 Bronchiectasis, uncomplicated: Secondary | ICD-10-CM

## 2017-02-27 DIAGNOSIS — J4540 Moderate persistent asthma, uncomplicated: Secondary | ICD-10-CM | POA: Diagnosis not present

## 2017-02-27 DIAGNOSIS — R05 Cough: Secondary | ICD-10-CM | POA: Diagnosis not present

## 2017-02-27 DIAGNOSIS — I10 Essential (primary) hypertension: Secondary | ICD-10-CM

## 2017-02-27 DIAGNOSIS — R058 Other specified cough: Secondary | ICD-10-CM

## 2017-02-27 DIAGNOSIS — R918 Other nonspecific abnormal finding of lung field: Secondary | ICD-10-CM | POA: Diagnosis not present

## 2017-02-27 LAB — CBC WITH DIFFERENTIAL/PLATELET
BASOS ABS: 0.1 10*3/uL (ref 0.0–0.1)
Basophils Relative: 1.1 % (ref 0.0–3.0)
EOS ABS: 0.1 10*3/uL (ref 0.0–0.7)
Eosinophils Relative: 0.8 % (ref 0.0–5.0)
HCT: 47.8 % — ABNORMAL HIGH (ref 36.0–46.0)
Hemoglobin: 15.9 g/dL — ABNORMAL HIGH (ref 12.0–15.0)
LYMPHS ABS: 2 10*3/uL (ref 0.7–4.0)
Lymphocytes Relative: 24.5 % (ref 12.0–46.0)
MCHC: 33.2 g/dL (ref 30.0–36.0)
MCV: 85.8 fl (ref 78.0–100.0)
Monocytes Absolute: 0.7 10*3/uL (ref 0.1–1.0)
Monocytes Relative: 8.3 % (ref 3.0–12.0)
NEUTROS PCT: 65.3 % (ref 43.0–77.0)
Neutro Abs: 5.3 10*3/uL (ref 1.4–7.7)
PLATELETS: 318 10*3/uL (ref 150.0–400.0)
RBC: 5.57 Mil/uL — ABNORMAL HIGH (ref 3.87–5.11)
RDW: 13.9 % (ref 11.5–15.5)
WBC: 8.1 10*3/uL (ref 4.0–10.5)

## 2017-02-27 LAB — NITRIC OXIDE: NITRIC OXIDE: 26

## 2017-02-27 LAB — SEDIMENTATION RATE: Sed Rate: 3 mm/hr (ref 0–30)

## 2017-02-27 MED ORDER — MOMETASONE FURO-FORMOTEROL FUM 100-5 MCG/ACT IN AERO: INHALATION_SPRAY | RESPIRATORY_TRACT | 11 refills | Status: DC

## 2017-02-27 NOTE — Progress Notes (Signed)
Subjective:     Patient ID: Kristy [name], female   DOB: 15-Jul-1941,     MRN: [id number]  HPI  89 yowf never smoker with lots of passive esposure with new onset wheezing mid 1990s  eval by Dr Bernita Buffy dx with cat /dog/grass rx with allergy shots x several years better s other treatments needed but still got "bronchitis" several times per year rx pred/abx no need for regular inhalers >  eval by [facility] when Dr Lorin Picket Retired rec dulera and did better until around 2010 started coughing after supper when took losartan but better p stopped losartan as of March 2018 and referred to pulmonary clinic 02/27/2017 by Dr   Helene Kelp for abn ct chest done as part of the work up cough      02/27/2017 1st [facility] office visit/ Paulette Rockford  Re bronchiectasis Chief Complaint  Patient presents with  . Pulmonary Consult    Referred by Dr. Yetta Barre. Pt c/o cough for the past 8 yrs.    cough has present for 8 years, better since March 2018, cough some every 24 hours seems worse after supper  Takes dulera 200 2 puffs in am and then again after taking pm dose of dulera starts coughing  Most productive p supper with clear mucus  "95% better p losartan stopped so that's not why she's here"   Not limited by breathing from desired activities    No obvious day to day or daytime variability or assoc excess/ purulent sputum or mucus plugs or hemoptysis or cp or chest tightness, subjective wheeze or overt sinus or hb symptoms. No unusual exp hx or h/o childhood pna/ asthma or knowledge of premature birth.  Sleeping ok without nocturnal  or early am exacerbation  of respiratory  c/o's or need for noct saba. Also denies any obvious fluctuation of symptoms with weather or environmental changes or other aggravating or alleviating factors except as outlined above   Current Medications, Allergies, Complete Past Medical History, Past Surgical History, Family History, and Social History were reviewed in [employer] record.  ROS  The following are not active complaints unless bolded sore throat, dysphagia, dental problems, itching, sneezing,  nasal congestion or excess/ purulent secretions, ear ache,   fever, chills, sweats, unintended wt loss, classically pleuritic or exertional cp,  orthopnea pnd or leg swelling, presyncope, palpitations, abdominal pain, anorexia, nausea, vomiting, diarrhea  or change in bowel or bladder habits, change in stools or urine, dysuria,hematuria,  rash, arthralgias, visual complaints, headache, numbness, weakness or ataxia or problems with walking or coordination,  change in mood/affect or memory.           Review of Systems     Objective:   Physical Exam    amb wf nad but   failed to answer a single question asked in a straightforward manner, tending to go off on tangents or answer questions with ambiguous medical terms or diagnoses and seemed perplexed  when asked the same question more than once for clarification using terms like "bronchitis, allergies,  and lung nodules" instead of answering questions about symptoms in lay terms in which they were asked.   Wt Readings from Last 3 Encounters:  02/27/17 119 lb (54 kg)  08/16/15 118 lb (53.5 kg)  10/20/07 136 lb 6.4 oz (61.9 kg)    Vital signs reviewed   - Note on arrival 02 sats  100% on RA    HEENT: nl dentition, turbinates bilaterally, and oropharynx. Nl external  ear canals without cough reflex   NECK :  without JVD/Nodes/TM/ nl carotid upstrokes bilaterally   LUNGS: no acc muscle use,  Nl contour chest which is clear to A and P bilaterally without cough on insp or exp maneuvers   CV:  RRR  no s3 or murmur or increase in P2, and no edema   ABD:  soft and nontender with nl inspiratory excursion in the supine position. No bruits or organomegaly appreciated, bowel sounds nl  MS:  Nl gait/ ext warm without deformities, calf tenderness, cyanosis or clubbing No obvious joint restrictions    SKIN: warm and dry without lesions    NEURO:  alert, approp, nl sensorium with  no motor or cerebellar deficits apparent.     I personally reviewed images and agree with radiology impression as follows:  CT w/o contrast Chest  10/31/16  cylindrical bronchiectasis bilaterally esp rml / lingula assoc with peribronchovasular micro and maroc nodularity which have progressed since 2008     Lab Results  Component Value Date   ESRSEDRATE 3 02/27/2017    Labs ordered 02/27/2017   =  Allergy profile   Assessment:

## 2017-02-27 NOTE — Patient Instructions (Addendum)
Plan A = Automatic =  dulera 100 Take 2 puffs first thing in am and then another 2 puffs about 12 hours later.     Plan B = Backup Only use your albuterol [name])  as a rescue medication to be used if you can't catch your breath by resting or doing a relaxed purse lip breathing pattern.  - The less you use it, the better it will work when you need it. - Ok to use the inhaler up to 2 puffs  every 4 hours if you must but call for appointment if use goes up over your usual need - Don't leave home without it !!  (think of it like the spare tire for your car)   Try prilosec otc 20mg   Take 30-60 min before first meal of the day and [name] 20 mg one right after supper  until  Return  GERD (REFLUX)  is an extremely common cause of respiratory symptoms just like yours , many times with no obvious heartburn at all.    It can be treated with medication, but also with lifestyle changes including elevation of the head of your bed (ideally with 6 inch  bed blocks),  Smoking cessation, avoidance of late meals, excessive alcohol, and avoid fatty foods, chocolate, peppermint, colas, red wine, and acidic juices such as orange juice.  NO MINT OR MENTHOL PRODUCTS SO NO COUGH DROPS   USE SUGARLESS CANDY INSTEAD (Jolley ranchers or Stover's or Life Savers) or even ice chips will also do - the key is to swallow to prevent all throat clearing. NO OIL BASED VITAMINS - use powdered substitutes.       Please remember to go to the lab department downstairs in the basement  for your tests - we will call you with the results when they are available  Please schedule a follow up office visit in 6 weeks, call sooner if needed with all medications /inhalers/ solutions in hand so we can verify exactly what you are taking. This includes all medications from all doctors and over the counters  Add:  Will need alpha one testing and quant immunoglobulins on return to complete the w/u plus full pfts

## 2017-03-02 DIAGNOSIS — R918 Other nonspecific abnormal finding of lung field: Secondary | ICD-10-CM | POA: Insufficient documentation

## 2017-03-02 DIAGNOSIS — I10 Essential (primary) hypertension: Secondary | ICD-10-CM | POA: Insufficient documentation

## 2017-03-02 NOTE — Assessment & Plan Note (Signed)
Allergy profile 02/27/2017 >  Eos 0.1 /  IgE pending  The most common causes of chronic cough in immunocompetent adults include the following: upper airway cough syndrome (UACS), previously referred to as postnasal drip syndrome (PNDS), which is caused by variety of rhinosinus conditions; (2) asthma; (3) GERD; (4) chronic bronchitis from cigarette smoking or other inhaled environmental irritants; (5) nonasthmatic eosinophilic bronchitis; and (6) bronchiectasis.   These conditions, singly or in combination, have accounted for up to 94% of the causes of chronic cough in prospective studies.   Other conditions have constituted no >6% of the causes in prospective studies These have included bronchogenic carcinoma, chronic interstitial pneumonia, sarcoidosis, left ventricular failure, ACEI-induced cough, and aspiration from a condition associated with pharyngeal dysfunction.    Chronic cough is often simultaneously caused by more than one condition. A single cause has been found from 38 to 82% of the time, multiple causes from 18 to 62%. Multiply caused cough has been the result of three diseases up to 42% of the time.    I suspect she has uacs in addition to bronchiectasis (see separate a/p) possibly from sinus dz and possibly from rhinitis/gerd  rec first start max rx for gerd and reduct the dulera to 100 2bid as the higher strength irritates the upper airway and so the higher may paradoxically work better than the higher

## 2017-03-02 NOTE — Assessment & Plan Note (Signed)
FENO 02/27/2017  =   27 - changed dulera 200 to 100 due to prominent daytime cough

## 2017-03-02 NOTE — Assessment & Plan Note (Signed)
Stopped losartan 12/2016 and cough improved  For reasons that may related to vascular permability and nitric oxide pathways but not elevated  bradykinin levels (as seen with  ACEi use) losartan in the generic form has been reported now from mulitple sources  to cause a similar pattern of non-specific  upper airway symptoms as seen with acei.   This has not been reported with exposure to the other ARB's to date, so it seems reasonable for now to try either generic diovan or avapro if ARB needed or use an alternative class altogether.  See:  Lelon Frohlich Allergy Asthma Immunol  2008: 101: p [phone]    Although even in retrospect it may not be clear the ARB  contributed to the pt's symptoms,  Pt improved off them and adding them back at this point or in the future would risk confusion in interpretation of non-specific respiratory symptoms to which this patient is prone  ie  Better not to muddy the waters here.   Follow up per Primary Care planned

## 2017-03-02 NOTE — Assessment & Plan Note (Addendum)
See CT chest 10/31/16 from [facility]  This is an extremely common benign condition in the elderly and does not warrant aggressive eval/ rx at this point unless there is a clinical correlation suggesting unaddressed pulmonary infection (purulent sputum, night sweats, unintended wt loss, doe) or evolution of  obvious changes on plain cxr (as opposed to serial CT, which is way over sensitive to make clinical decisions re intervention and treatment in the elderly, who tend to tolerate both dx and treatment poorly) .   Discussed in detail all the  indications, usual  risks and alternatives  relative to the benefits with patient who agrees to proceed with conservative f/u as outlined

## 2017-03-02 NOTE — Assessment & Plan Note (Addendum)
Spirometry  08/16/15  FEV1 1.20 (59%)  Ratio 70 though didn't blow out 6 sec with some curvature   -  02/27/2017 changed dulera 200 to 100 and instructed on use   Clearly longstanding ? 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 than pathogenic clinically though time will tell  For now rec rx airways with dulera 100/ rx gerd   Will need alpha one testing and quant immunoglobulins on return to complete the w/u plus full pfts   Total time devoted to counseling  > 50 % of initial 60 min office visit:  review case with pt/ discussion of options/alternatives/ personally creating written customized instructions  in presence of pt  then going over those specific  Instructions directly with the pt including how to use all of the meds but in particular covering each new medication in detail and the difference between the maintenance= "automatic" meds and the prns using an action plan format for the latter (If this problem/symptom => do that organization reading Left to right).  Please see AVS from this visit for a full list of these instructions which I personally wrote for this pt and  are unique to this visit.

## 2017-03-03 LAB — RESPIRATORY ALLERGY PROFILE REGION II ~~LOC~~
Allergen, Cedar tree, t12: <0.1 kU/L
Allergen, Cottonwood, t14: <0.1 kU/L
Allergen, D pternoyssinus,d7: <0.1 kU/L
Allergen, Mouse Urine Protein, e78: <0.1 kU/L
Allergen, Oak,t7: <0.1 kU/L
Bermuda Grass: <0.1 kU/L
Cat Dander: 0.17 kU/L — ABNORMAL HIGH
Common Ragweed: 0.43 kU/L — ABNORMAL HIGH
D. farinae: <0.1 kU/L
Dog Dander: <0.1 kU/L
IGE (IMMUNOGLOBULIN E), SERUM: 11 kU/L (ref <115)
Johnson Grass: 0.14 kU/L — ABNORMAL HIGH
Pecan/Hickory Tree IgE: <0.1 kU/L
Timothy Grass: 1.05 kU/L — ABNORMAL HIGH

## 2017-03-04 NOTE — Progress Notes (Signed)
Spoke with pt and notified of results per Dr. Wert. Pt verbalized understanding and denied any questions. 

## 2017-04-10 ENCOUNTER — Encounter: Payer: Self-pay | Admitting: Internal Medicine

## 2017-04-10 ENCOUNTER — Ambulatory Visit (INDEPENDENT_AMBULATORY_CARE_PROVIDER_SITE_OTHER): Payer: Medicare Other | Admitting: Internal Medicine

## 2017-04-10 VITALS — BP 160/88 | HR 76 | Ht 63.75 in | Wt 121.2 lb

## 2017-04-10 DIAGNOSIS — J479 Bronchiectasis, uncomplicated: Secondary | ICD-10-CM

## 2017-04-10 NOTE — Progress Notes (Signed)
Subjective:     Patient ID: Kristy [name], female   DOB: [birth year],     MRN: [id number]    Brief patient profile:  60 yowf never smoker with lots of passive esposure with new onset wheezing mid 1990s  eval by Dr Bernita Buffy dx with cat /dog/grass rx with allergy shots x several years better s other treatments needed but still got "bronchitis" several times per year rx pred/abx no need for regular inhalers >  eval by [facility] when Dr Lorin Picket Retired rec dulera and did better until around 2010 started coughing after supper when took losartan but better p stopped losartan as of March 2018 and referred to pulmonary clinic 02/27/2017 by Dr   Helene Kelp for abn ct chest done as part of the work up cough     History of Present Illness  02/27/2017 1st [facility] office visit/ Kristy [name]  Re bronchiectasis Chief Complaint  Patient presents with  . Pulmonary Consult    Referred by Dr. Yetta Barre. Pt c/o cough for the past 8 yrs.    cough has present for 8 years, better since March 2018, cough some every 24 hours seems worse after supper  Takes dulera 200 2 puffs in am and then again after taking pm dose of dulera starts coughing  Most productive p supper with clear mucus  "95% better p losartan stopped so that's not why she's here"  Not limited by breathing from desired activities   rec Plan A = Automatic =  dulera 100 Take 2 puffs first thing in am and then another 2 puffs about 12 hours later.  Plan B = Backup Only use your albuterol Kristy [name])    Try prilosec otc 20mg   Take 30-60 min before first meal of the day and [name] 20 mg one right after supper  until  Return GERD     04/10/2017  f/u ov/Kristy [name] re: obst  Bronchiectasis  maint on dulera 100 2bid / gerd rx  Chief Complaint  Patient presents with  . Follow-up    Cough has improved- coughing less and not producing less sputum, yellow in color.      Not limited by breathing from desired activities    No obvious day to day or daytime  variability or assoc excess/ purulent sputum or mucus plugs or hemoptysis or cp or chest tightness, subjective wheeze or overt sinus or hb symptoms. No unusual exp hx or h/o childhood pna/ asthma or knowledge of premature birth.  Sleeping ok without nocturnal  or early am exacerbation  of respiratory  c/o's or need for noct saba. Also denies any obvious fluctuation of symptoms with weather or environmental changes or other aggravating or alleviating factors except as outlined above   Current Medications, Allergies, Complete Past Medical History, Past Surgical History, Family History, and Social History were reviewed in [employer] record.  ROS  The following are not active complaints unless bolded sore throat, dysphagia, dental problems, itching, sneezing,  nasal congestion or excess/ purulent secretions, ear ache,   fever, chills, sweats, unintended wt loss, classically pleuritic or exertional cp,  orthopnea pnd or leg swelling, presyncope, palpitations, abdominal pain, anorexia, nausea, vomiting, diarrhea  or change in bowel or bladder habits, change in stools or urine, dysuria,hematuria,  rash, arthralgias, visual complaints, headache, numbness, weakness or ataxia or problems with walking or coordination,  change in mood/affect or memory.                 Objective:  Physical Exam    amb wf nad    04/10/2017         121  02/27/17 119 lb (54 kg)  08/16/15 118 lb (53.5 kg)  10/20/07 136 lb 6.4 oz (61.9 kg)    Vital signs reviewed   - Note on arrival 02 sats  98% on RA and bp 160/88     HEENT: nl dentition, turbinates bilaterally, and oropharynx. Nl external ear canals without cough reflex   NECK :  without JVD/Nodes/TM/ nl carotid upstrokes bilaterally   LUNGS: no acc muscle use,  Nl contour chest which is clear to A and P bilaterally without cough on insp or exp maneuvers   CV:  RRR  no s3 or murmur or increase in P2, and no edema   ABD:  soft and  nontender with nl inspiratory excursion in the supine position. No bruits or organomegaly appreciated, bowel sounds nl  MS:  Nl gait/ ext warm without deformities, calf tenderness, cyanosis or clubbing No obvious joint restrictions   SKIN: warm and dry without lesions    NEURO:  alert, approp, nl sensorium with  no motor or cerebellar deficits apparent.     I personally reviewed images and agree with radiology impression as follows:  CT w/o contrast Chest  10/31/16 cylindrical bronchiectasis bilaterally esp rml / lingula assoc with peribronchovasular micro and maroc nodularity which have progressed since 2008        Assessment:       Outpatient Encounter Prescriptions as of 04/10/2017  Medication Sig  . B Complex Vitamins (B COMPLEX PO) Take by mouth daily.  Marland Kitchen BIOTIN PO Take 1 capsule by mouth daily.  . Calcium Carbonate (CALCIUM 600 PO) Take 1 tablet by mouth 2 (two) times daily.  . Estradiol (VAGIFEM) 10 MCG TABS vaginal tablet Place 10 mcg vaginally 2 (two) times a week.  . ezetimibe (ZETIA) 10 MG tablet Take 10 mg by mouth daily.  . famotidine (PEPCID) 20 MG tablet Take 20 mg by mouth at bedtime.  Marland Kitchen MAGNESIUM PO Take by mouth daily.  . mometasone-formoterol (DULERA) 100-5 MCG/ACT AERO Take 2 puffs first thing in am and then another 2 puffs about 12 hours later.  Marland Kitchen omeprazole (PRILOSEC) 20 MG capsule Take 20 mg by mouth daily before breakfast.  . Potassium 99 MG TABS Take 1 tablet by mouth daily.  Marland Kitchen zinc gluconate 50 MG tablet Take 50 mg by mouth daily.  . [DISCONTINUED] CHERRY PO Take 1 tablet by mouth daily.   No facility-administered encounter medications on file as of 04/10/2017.

## 2017-04-10 NOTE — Patient Instructions (Signed)
If you are satisfied with your treatment plan,  let your doctor know and he/she can either refill your medications or you can return here when your prescription runs out.     If in any way you are not 100% satisfied,  please tell us.  If 100% better, tell your friends!  Pulmonary follow up is as needed   - please bring your spacer with you to confirm you are using it correctly

## 2017-04-12 NOTE — Assessment & Plan Note (Signed)
Spirometry  08/16/15  FEV1 1.20 (59%)  Ratio 70 though didn't blow out 6 sec with some curvature   -  02/27/2017 changed dulera 200 to 100 and instructed on use   - 04/10/2017  After extensive coaching HFA effectiveness =    75%   Adequate control on present rx, reviewed in detail with pt > no change in rx needed    She is better to her satisfaction but will always likely cough some Reviewed MC dysfunction and risk for flares but no need for escalation of care absent more symptoms so pulmonary f/u can be prn   Each maintenance medication was reviewed in detail including most importantly the difference between maintenance and as needed and under what circumstances the prns are to be used.  Please see AVS for specific  Instructions which are unique to this visit and I personally typed out  which were reviewed in detail in writing with the patient and a copy provided.

## 2017-05-05 DIAGNOSIS — H52223 Regular astigmatism, bilateral: Secondary | ICD-10-CM | POA: Diagnosis not present

## 2017-05-05 DIAGNOSIS — H31091 Other chorioretinal scars, right eye: Secondary | ICD-10-CM | POA: Diagnosis not present

## 2017-05-05 DIAGNOSIS — H43813 Vitreous degeneration, bilateral: Secondary | ICD-10-CM | POA: Diagnosis not present

## 2017-05-05 DIAGNOSIS — H524 Presbyopia: Secondary | ICD-10-CM | POA: Diagnosis not present

## 2017-06-10 DIAGNOSIS — R3129 Other microscopic hematuria: Secondary | ICD-10-CM | POA: Diagnosis not present

## 2017-06-10 DIAGNOSIS — K5732 Diverticulitis of large intestine without perforation or abscess without bleeding: Secondary | ICD-10-CM | POA: Diagnosis not present

## 2017-06-10 DIAGNOSIS — Z6820 Body mass index (BMI) 20.0-20.9, adult: Secondary | ICD-10-CM | POA: Diagnosis not present

## 2017-07-08 DIAGNOSIS — D1801 Hemangioma of skin and subcutaneous tissue: Secondary | ICD-10-CM | POA: Diagnosis not present

## 2017-07-08 DIAGNOSIS — L821 Other seborrheic keratosis: Secondary | ICD-10-CM | POA: Diagnosis not present

## 2017-07-08 DIAGNOSIS — Z85820 Personal history of malignant melanoma of skin: Secondary | ICD-10-CM | POA: Diagnosis not present

## 2017-07-15 DIAGNOSIS — E782 Mixed hyperlipidemia: Secondary | ICD-10-CM | POA: Diagnosis not present

## 2017-07-15 DIAGNOSIS — Z79899 Other long term (current) drug therapy: Secondary | ICD-10-CM | POA: Diagnosis not present

## 2017-07-22 DIAGNOSIS — M810 Age-related osteoporosis without current pathological fracture: Secondary | ICD-10-CM | POA: Diagnosis not present

## 2017-07-22 DIAGNOSIS — Z780 Asymptomatic menopausal state: Secondary | ICD-10-CM | POA: Diagnosis not present

## 2017-07-22 DIAGNOSIS — Z0000 Encounter for general adult medical examination without abnormal findings: Secondary | ICD-10-CM | POA: Diagnosis not present

## 2017-07-22 DIAGNOSIS — E782 Mixed hyperlipidemia: Secondary | ICD-10-CM | POA: Diagnosis not present

## 2017-07-22 DIAGNOSIS — Z23 Encounter for immunization: Secondary | ICD-10-CM | POA: Diagnosis not present

## 2017-07-22 DIAGNOSIS — J4530 Mild persistent asthma, uncomplicated: Secondary | ICD-10-CM | POA: Diagnosis not present

## 2017-12-07 ENCOUNTER — Telehealth: Payer: Self-pay | Admitting: Internal Medicine

## 2017-12-07 NOTE — Telephone Encounter (Signed)
I had not rec pulmonary f/u so technically she would be self referred which is fine with me if MR accepts self referrals or alternatively pcp could refer to him

## 2017-12-07 NOTE — Telephone Encounter (Signed)
Pt is requesting to switch from MW to MR. I have made pt aware of our office protocol.  MW and MR please advise on switch. Thanks  AVS 04/10/17: If you are satisfied with your treatment plan,  let your doctor know and he/she can either refill your medications or you can return here when your prescription runs out.     If in any way you are not 100% satisfied,  please tell us.  If 100% better, tell your friends!  Pulmonary follow up is as needed   - please bring your spacer with you to confirm you are using it correctly

## 2017-12-07 NOTE — Telephone Encounter (Signed)
MR please advise if you're willing to take on patient.  Thanks!

## 2017-12-08 NOTE — Telephone Encounter (Signed)
Called pt and advised message from the provider. Pt understood and verbalized understanding. Nothing further is needed.   Made an appt with MR.

## 2017-12-08 NOTE — Telephone Encounter (Signed)
I am happy to take her on but let her know that Dr Lake Bells has spl interest in bronchiectasis. So, she has choic. Of course if she wants to see him because of that then he will have to be willing to take her on  .    Dr. Brand Males, M.D., [facility].C.P Pulmonary and Critical Care Medicine Staff Physician, [place] Director - Interstitial Lung Disease  Program  Pulmonary [place] at [place], [place], [address]  Pager: [phone], If no answer or between  15:00h - 7:00h: call [phone] Telephone: [phone]

## 2017-12-28 ENCOUNTER — Encounter: Payer: Self-pay | Admitting: Internal Medicine

## 2017-12-28 ENCOUNTER — Other Ambulatory Visit: Payer: Medicare Other

## 2017-12-28 ENCOUNTER — Ambulatory Visit (INDEPENDENT_AMBULATORY_CARE_PROVIDER_SITE_OTHER): Payer: Medicare Other | Admitting: Internal Medicine

## 2017-12-28 VITALS — BP 142/90 | HR 80 | Ht 63.75 in | Wt 121.6 lb

## 2017-12-28 DIAGNOSIS — J479 Bronchiectasis, uncomplicated: Secondary | ICD-10-CM

## 2017-12-28 MED ORDER — FLUTTER DEVI: 0 refills | Status: AC

## 2017-12-28 NOTE — Patient Instructions (Addendum)
ICD-10-CM   1. Bronchiectasis without complication ([place]) R75.4     YOu have this since atleast 2008 Symptoms are likely due to this  Plan - clearing mucus is very important - first identify ccause. Do the following blood work  - check alpha 1, Quanitferon Gold, ANA, DS DNA, Ccp, SSa/ssB, SCL-70  - Check IgG, IgM, IgA levels  - check alpha 1 - start flutter valve 10 times daily - give sputum gram stain and culture and AFB smear and culture if you can 12/28/2017 - if you cannot , do not worry - start mucinex tablets - take samples, and discount card - continue dulera for now; can discuss add on therapy such as saline nebulizer at followup - do full PFT next 4-8 weels  Followu 4-8 weeks but after completing all of above

## 2017-12-28 NOTE — Progress Notes (Signed)
Subjective:     Patient ID: Kristy [name], female   DOB: 11-18-1940, 77 y.o.   MRN: [id number]  HPI Brief patient profile:  21 yowf never smoker with lots of passive esposure with new onset wheezing mid 1990s  eval by Dr Bernita Buffy dx with cat /dog/grass rx with allergy shots x several years better s other treatments needed but still got "bronchitis" several times per year rx pred/abx no need for regular inhalers >  eval by [facility] when Dr Lorin Picket Retired rec dulera and did better until around 2010 started coughing after supper when took losartan but better p stopped losartan as of March 2018 and referred to pulmonary clinic 02/27/2017 by Dr   Helene Kelp for abn ct chest done as part of the work up cough     History of Present Illness  02/27/2017 1st [facility] office visit/ Wert  Re bronchiectasis Chief Complaint  Patient presents with  . Pulmonary Consult    Referred by Dr. Yetta Barre. Pt c/o cough for the past 8 yrs.    cough has present for 8 years, better since March 2018, cough some every 24 hours seems worse after supper  Takes dulera 200 2 puffs in am and then again after taking pm dose of dulera starts coughing  Most productive p supper with clear mucus  "95% better p losartan stopped so that's not why she's here"  Not limited by breathing from desired activities   rec Plan A = Automatic =  dulera 100 Take 2 puffs first thing in am and then another 2 puffs about 12 hours later.  Plan B = Backup Only use your albuterol Kristy [name])    Try prilosec otc 20mg   Take 30-60 min before first meal of the day and [name] 20 mg one right after supper  until  Return GERD     04/10/2017  f/u ov/Wert re: obst  Bronchiectasis  maint on dulera 100 2bid / gerd rx  Chief Complaint  Patient presents with  . Follow-up    Cough has improved- coughing less and not producing less sputum, yellow in color.      Not limited by breathing from desired activities    No obvious day to day or  daytime variability or assoc excess/ purulent sputum or mucus plugs or hemoptysis or cp or chest tightness, subjective wheeze or overt sinus or hb symptoms. No unusual exp hx or h/o childhood pna/ asthma or knowledge of premature birth.  Sleeping ok without nocturnal  or early am exacerbation  of respiratory  c/o's or need for noct saba. Also denies any obvious fluctuation of symptoms with weather or environmental changes or other aggravating or alleviating factors except as outlined above    OV 12/28/2017  Chief Complaint  Patient presents with  . Consult    Switching from MW to MR due to obstructive bronchiectasis.  Pt states she was diagnosed at [facility] with obstructive bronchiectasis 10/2016 but is unsure if that is what she has. Pt states at times, she has c/o SOB, cough with yellow to green mucus, and CP.  Pt states she has an occ. wheeze and has to try to cough hard to be able to get mucus up.    77 year old female from [place], [place] here for transfer of care from Dr. Melvyn Novas to Dr. Chase Caller for bronchiectasis  Patient tells me that she is not sure about the  diagnosis of bronchiectasis and wants to make sure she has this diagnosis.  And at that time it was only nocturnal cough.  It was associated with [name] usage.  Around this time he tells me that she started with chronic cough some 15 years ago.  The [name] was stopped and the cough improved then several years later approximately 10 years ago the cough returned and again it was a nocturnal cough associated with sputum production particularly at night.  The amount of sputum she brings out since then is around one third of a cup and it is discolored with white yellowish color.  Occasionally she will flareup requiring antibiotic/prednisone.  But for most of the time daytime she is fine except in the evenings and at night she starts having significant amount of cough and she has to strain to bring up the sputum.  She had a CT  scan of the chest in 2008 para personally visualized that showed bilateral lingula and right middle lobe bronchiectasis but she is unaware of this diagnosis back then.  She tells me that approximately 1 year ago she got hospitalized at [facility] and treated for bronchiectasis exacerbation [initially there was confusion whether she might have a pulmonary embolism] and around this time her losartan was stopped and with this the cough improved.  She has been on [facility] for several years which only helps her shortness of breath but not the mucus production.  Nevertheless overall she continues to have not evening mucus production with the cough and this happens every day and it is affecting her quality of life by preventing going to the church.  She has had allergy workup as above and below and it is negative so far.  2018 CT scan of the chest that I personally visualized shows continued persistence of the lingula and right middle lobe bronchiectasis.  She is not aware of having had etiologic workup.  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  Results for Kristy [name], Kristy [name] (MRN [id number]) as of 12/28/2017 10:23  Ref. Range 02/27/2017 12:40  Sed Rate Latest Ref Range: 0 - 30 mm/hr 3  [name] IgE Latest Units: kU/L <0.10  Pecan/Hickory Tree IgE Latest Units: kU/L <0.10  IgE (Immunoglobulin E), Serum Latest Ref Range: <115 kU/L 11  Allergen, D pternoyssinus,d7 Latest Units: kU/L <0.10  Cat Dander Latest Units: kU/L 0.17 (H)  Dog Dander Latest Units: kU/L <0.10  [place] Grass Latest Units: kU/L <0.10  Johnson Grass Latest Units: kU/L 0.14 (H)  Timothy Grass Latest Units: kU/L 1.05 (H)  Cockroach Latest Units: kU/L <0.10  Aspergillus fumigatus, m3 Latest Units: kU/L <0.10  Allergen, Comm Silver Wendee Copp, t9 Latest Units: kU/L <0.10  Allergen, Cottonwood, t14 Latest Units: kU/L <0.10  Elm IgE Latest Units: kU/L <0.10   Allergen, Mulberry, t76 Latest Units: kU/L <0.10  Allergen, Oak,t7 Latest Units: kU/L <0.10  Common Ragweed Latest Units: kU/L 0.43 (H)  Allergen, Mouse Urine Protein, e78 Latest Units: kU/L <0.10  D. farinae Latest Units: kU/L <0.10  Allergen, Cedar tree, t12 Latest Units: kU/L <0.10  Box Elder IgE Latest Units: kU/L <0.10  Rough Pigweed  IgE Latest Units: kU/L <0.10   Results for Kristy [name], Kristy [name] (MRN [id number]) as of 12/28/2017 10:51  Ref. Range 02/27/2017 12:23 02/27/2017 12:40  Nitric Oxide Unknown 26     has a past medical history of Asthma.   reports that she has never smoked. She has never used smokeless tobacco.   Allergies  Allergen Reactions  .  Avelox [Moxifloxacin Hcl In Nacl]   . Ciprofloxacin   . Doxycycline Diarrhea  . Floxin [Ofloxacin]   . Iohexol      Code: HIVES, Desc: PER RHONDA [place] @ DR SOOD OFFICE, 05/04/07 (PT IS ALLERGIC TO CONTRAST) CHANGE TO W/O CM. RM, Onset Date: [id number]   . Levaquin [Levofloxacin In D5w]   . Maxaquin [Lomefloxacin]   . Noroxin [Norfloxacin]   . Quinolones   . Tequin [Gatifloxacin]   . Trovan [Alatrofloxacin]   . Zagam [Sparfloxacin]     Immunization History  Administered Date(s) Administered  . Influenza, High Dose Seasonal PF 07/22/2017  . Pneumococcal Polysaccharide-23 10/20/2007    Family History  Problem Relation Age of Onset  . Allergic rhinitis Neg Hx   . Angioedema Neg Hx   . Asthma Neg Hx   . Atopy Neg Hx   . Eczema Neg Hx   . Immunodeficiency Neg Hx   . Urticaria Neg Hx      Current Outpatient Medications:  .  B Complex Vitamins (B COMPLEX PO), Take by mouth daily., Disp: , Rfl:  .  BIOTIN PO, Take 1 capsule by mouth daily., Disp: , Rfl:  .  Borage, Borago officinalis, (BORAGE OIL) 1000 MG CAPS, Take 1 capsule by mouth daily., Disp: , Rfl:  .  Calcium Carbonate (CALCIUM 600 PO), Take 1 tablet by mouth 2 (two) times daily., Disp: , Rfl:  .  diphenhydrAMINE (ALLERGY RELIEF) 25 mg capsule, Take 25 mg by  mouth at bedtime., Disp: , Rfl:  .  Estradiol (VAGIFEM) 10 MCG TABS vaginal tablet, Place 10 mcg vaginally 2 (two) times a week., Disp: , Rfl:  .  ezetimibe (ZETIA) 10 MG tablet, Take 10 mg by mouth daily., Disp: , Rfl:  .  HAWTHORN EXTRACT PO, Take 600 mg by mouth daily., Disp: , Rfl:  .  MAGNESIUM PO, Take by mouth daily., Disp: , Rfl:  .  mometasone-formoterol (DULERA) 200-5 MCG/ACT AERO, Inhale 2 puffs into the lungs 2 (two) times daily., Disp: , Rfl:  .  OVER THE COUNTER MEDICATION, Take 1 capsule by mouth daily., Disp: , Rfl:    Review of Systems     Objective:   Physical Exam  Constitutional: She is oriented to person, place, and time. She appears well-developed and well-nourished. No distress.  HENT:  Head: Normocephalic and atraumatic.  Right Ear: External ear normal.  Left Ear: External ear normal.  Mouth/Throat: Oropharynx is clear and moist. No oropharyngeal exudate.  Eyes: Pupils are equal, round, and reactive to light. Conjunctivae and EOM are normal. Right eye exhibits no discharge. Left eye exhibits no discharge. No scleral icterus.  Neck: Normal range of motion. Neck supple. No JVD present. No tracheal deviation present. No thyromegaly present.  Cardiovascular: Normal rate, regular rhythm, normal heart sounds and intact distal pulses. Exam reveals no gallop and no friction rub.  No murmur heard. Pulmonary/Chest: Effort normal and breath sounds normal. No respiratory distress. She has no wheezes. She has no rales. She exhibits no tenderness.  Abdominal: Soft. Bowel sounds are normal. She exhibits no distension and no mass. There is no tenderness. There is no rebound and no guarding.  Musculoskeletal: Normal range of motion. She exhibits no edema or tenderness.  Lymphadenopathy:    She has no cervical adenopathy.  Neurological: She is alert and oriented to person, place, and time. She has normal reflexes. No cranial nerve deficit. She exhibits normal muscle tone.  Coordination normal.  Skin: Skin is warm and dry.  No rash noted. She is not diaphoretic. No erythema. No pallor.  Psychiatric: She has a normal mood and affect. Her behavior is normal. Judgment and thought content normal.  Vitals reviewed.  Vitals:   12/28/17 1023  BP: (!) 142/90  Pulse: 80  SpO2: 98%  Weight: 121 lb 9.6 oz (55.2 kg)  Height: 5' 3.75" (1.619 m)    Estimated body mass index is 21.04 kg/m as calculated from the following:   Height as of this encounter: 5' 3.75" (1.619 m).   Weight as of this encounter: 121 lb 9.6 oz (55.2 kg).     Assessment:       ICD-10-CM   1. Bronchiectasis without complication (HCC) W54.6 IgG, IgA, IgM    Alpha-1 antitrypsin phenotype    Respiratory or Resp and Sputum Culture    AFB Culture & Smear    Pulmonary function test        Plan:       YOu have this since atleast 2008 Symptoms are likely due to this and goal will be sympotom mgmt Bilateraly precludes surgery  Plan - clearing mucus is very important - first identify ccause. Do the following blood work  - check alpha 1, Quanitferon Gold, ANA, DS DNA, Ccp, SSa/ssB, SCL-70  - Check IgG, IgM, IgA levels  - check alpha 1 - start flutter valve 10 times daily - give sputum gram stain and culture and AFB smear/.culture -  if you can 12/28/2017 - if you cannot , do not worry - start mucinex tablets - take samples, and discount card - continue dulera for now; can discuss add on therapy such as saline nebulizer at followup - do full PFT next 4-8 weels  Followu 4-8 weeks but after completing all of above; consider bronch depending on aboe results    > 50% of this > 40 min visit spent in face to face counseling or/and coordination of care    Dr. Brand Males, M.D., [facility].C.P Pulmonary and Critical Care Medicine Staff Physician, [place] Director - Interstitial Lung Disease  Program  Pulmonary [place] at [place], [place], [address]  Pager: [phone], If no answer or between  15:00h - 7:00h: call [phone] Telephone: [phone]

## 2017-12-28 NOTE — Addendum Note (Signed)
Addended by: Madolyn Frieze on: 12/28/2017 03:38 PM   Modules accepted: Orders

## 2017-12-29 LAB — IGG, IGA, IGM
IGM, SERUM: 181 mg/dL (ref 48–271)
IMMUNOGLOBULIN A: 168 mg/dL (ref 81–463)
IgG (Immunoglobin G), Serum: 980 mg/dL (ref 694–1618)

## 2017-12-30 ENCOUNTER — Telehealth: Payer: Self-pay | Admitting: Internal Medicine

## 2017-12-30 DIAGNOSIS — J479 Bronchiectasis, uncomplicated: Secondary | ICD-10-CM

## 2017-12-30 NOTE — Telephone Encounter (Signed)
Ok to wait for alpha 1 till next visit  Dr. Brand Males, M.D., [facility].C.P Pulmonary and Critical Care Medicine Staff Physician, [place] Director - Interstitial Lung Disease  Program  Pulmonary [place] at [place], [place], [address]  Pager: [phone], If no answer or between  15:00h - 7:00h: call [phone] Telephone: [phone]

## 2017-12-30 NOTE — Telephone Encounter (Signed)
Noted.  Will go ahead and place a new order so that way I know order is placed as a future order.

## 2017-12-30 NOTE — Telephone Encounter (Signed)
Attempted to call pt but no answer.   Left message for pt to return our call x1 

## 2017-12-30 NOTE — Telephone Encounter (Signed)
Patient returned call, [phone]

## 2017-12-30 NOTE — Telephone Encounter (Signed)
Returned pt's call letting her know the results of the immunoglobulins.  While looking at pt's labwork, noticed that pt's alpha-1 antitrypsin phenotype was not placed in future so that lab was not collected.  MR, please advise if you want me to see if pt can come in and go to lab so we can reorder the alpha-1 lab or do you want to wait until pt's next visit with [place]?  Pt is scheduled to come back to our office 01/27/18.   Please advise on the above. Thanks!

## 2018-01-27 ENCOUNTER — Ambulatory Visit (INDEPENDENT_AMBULATORY_CARE_PROVIDER_SITE_OTHER): Payer: Medicare Other | Admitting: Internal Medicine

## 2018-01-27 ENCOUNTER — Other Ambulatory Visit: Payer: Medicare Other

## 2018-01-27 ENCOUNTER — Encounter: Payer: Self-pay | Admitting: Internal Medicine

## 2018-01-27 VITALS — BP 144/82 | HR 76 | Ht 63.75 in | Wt 119.8 lb

## 2018-01-27 DIAGNOSIS — J479 Bronchiectasis, uncomplicated: Secondary | ICD-10-CM

## 2018-01-27 DIAGNOSIS — R05 Cough: Secondary | ICD-10-CM

## 2018-01-27 DIAGNOSIS — R059 Cough, unspecified: Secondary | ICD-10-CM

## 2018-01-27 LAB — PULMONARY FUNCTION TEST
DL/VA % PRED: 126 %
DL/VA: 6.01 ml/min/mmHg/L
DLCO UNC % PRED: 78 %
DLCO UNC: 18.85 ml/min/mmHg
FEF 25-75 POST: 1.08 L/s
FEF 25-75 PRE: 1.09 L/s
FEF2575-%Change-Post: 0 %
FEF2575-%Pred-Post: 69 %
FEF2575-%Pred-Pre: 70 %
FEV1-%Change-Post: 0 %
FEV1-%Pred-Post: 64 %
FEV1-%Pred-Pre: 64 %
FEV1-Post: 1.3 L
FEV1-Pre: 1.31 L
FEV1FVC-%Change-Post: 3 %
FEV1FVC-%Pred-Pre: 105 %
FEV6-%Change-Post: -4 %
FEV6-%PRED-POST: 63 %
FEV6-%Pred-Pre: 65 %
FEV6-Post: 1.62 L
FEV6-Pre: 1.68 L
FEV6FVC-%PRED-POST: 105 %
FEV6FVC-%Pred-Pre: 105 %
FVC-%Change-Post: -4 %
FVC-%PRED-POST: 59 %
FVC-%Pred-Pre: 62 %
FVC-PRE: 1.68 L
FVC-Post: 1.62 L
PRE FEV1/FVC RATIO: 78 %
Post FEV1/FVC ratio: 81 %
Post FEV6/FVC ratio: 100 %
Pre FEV6/FVC Ratio: 100 %
RV % pred: 100 %
RV: 2.34 L
TLC % pred: 80 %
TLC: 4.02 L

## 2018-01-27 MED ORDER — TIOTROPIUM BROMIDE MONOHYDRATE 1.25 MCG/ACT IN AERS
2.0000 | INHALATION_SPRAY | Freq: Every day | RESPIRATORY_TRACT | 0 refills | Status: DC
Start: 2018-01-27 — End: 2018-08-10

## 2018-01-27 NOTE — Progress Notes (Signed)
PFT completed 01/27/18

## 2018-01-27 NOTE — Progress Notes (Signed)
Subjective:     Patient ID: Kristy [name], female   DOB: [birth year], 77 y.o.   MRN: [id number]  HPI  Brief patient profile:  70 yowf never smoker with lots of passive esposure with new onset wheezing mid 1990s  eval by Kristy [name] dx with cat /dog/grass rx with allergy shots x several years better s other treatments needed but still got "bronchitis" several times per year rx pred/abx no need for regular inhalers >  eval by [name] when Kristy [name] Retired rec dulera and did better until around 2010 started coughing after supper when took losartan but better p stopped losartan as of March 2018 and referred to pulmonary clinic 02/27/2017 by Kristy   Helene [name] for abn ct chest done as part of the work up cough     History of Present Illness  02/27/2017 1st [facility] office visit/ Kristy [name]  Re bronchiectasis Chief Complaint  Patient presents with  . Pulmonary Consult    Referred by Kristy. Yetta [name]. Pt c/o cough for the past 8 yrs.    cough has present for 8 years, better since March 2018, cough some every 24 hours seems worse after supper  Takes dulera 200 2 puffs in am and then again after taking pm dose of dulera starts coughing  Most productive p supper with clear mucus  "95% better p losartan stopped so that's not why she's here"  Not limited by breathing from desired activities   rec Plan A = Automatic =  dulera 100 Take 2 puffs first thing in am and then another 2 puffs about 12 hours later.  Plan B = Backup Only use your albuterol Kristy [name])    Try prilosec otc 20mg   Take 30-60 min before first meal of the day and [name] 20 mg one right after supper  until  Return GERD     04/10/2017  f/u ov/Kristy [name] re: obst  Bronchiectasis  maint on dulera 100 2bid / gerd rx  Chief Complaint  Patient presents with  . Follow-up    Cough has improved- coughing less and not producing less sputum, yellow in color.      Not limited by breathing from desired activities    No obvious day to day  or daytime variability or assoc excess/ purulent sputum or mucus plugs or hemoptysis or cp or chest tightness, subjective wheeze or overt sinus or hb symptoms. No unusual exp hx or h/o childhood pna/ asthma or knowledge of premature birth.  Sleeping ok without nocturnal  or early am exacerbation  of respiratory  c/o's or need for noct saba. Also denies any obvious fluctuation of symptoms with weather or environmental changes or other aggravating or alleviating factors except as outlined above    OV 12/28/2017  Chief Complaint  Patient presents with  . Consult    Switching from Kristy [name] to Kristy [name] due to obstructive bronchiectasis.  Pt states she was diagnosed at [facility] with obstructive bronchiectasis 10/2016 but is unsure if that is what she has. Pt states at times, she has c/o SOB, cough with yellow to green mucus, and CP.  Pt states she has an occ. wheeze and has to try to cough hard to be able to get mucus up.    77 year old female from [place], [place] here for transfer of care from Kristy. Melvyn [name] to Kristy. Chase [name] for bronchiectasis  Patient tells me that she is not sure about the  diagnosis of bronchiectasis and wants to make sure she has this  diagnosis.  And at that time it was only nocturnal cough.  It was associated with [name] usage.  Around this time he tells me that she started with chronic cough some 15 years ago.  The [name] was stopped and the cough improved then several years later approximately 10 years ago the cough returned and again it was a nocturnal cough associated with sputum production particularly at night.  The amount of sputum she brings out since then is around one third of a cup and it is discolored with white yellowish color.  Occasionally she will flareup requiring antibiotic/prednisone.  But for most of the time daytime she is fine except in the evenings and at night she starts having significant amount of cough and she has to strain to bring up the sputum.  She had a  CT scan of the chest in 2008 a personally visualized that showed bilateral lingula and right middle lobe bronchiectasis but she is unaware of this diagnosis back then.  She tells me that approximately 1 year ago she got hospitalized at [facility] and treated for bronchiectasis exacerbation [initially there was confusion whether she might have a pulmonary embolism] and around this time her losartan was stopped and with this the cough improved.  She has been on [facility] for several years which only helps her shortness of breath but not the mucus production.  Nevertheless overall she continues to have not evening mucus production with the cough and this happens every day and it is affecting her quality of life by preventing going to the church.  She has had allergy workup as above and below and it is negative so far.  2018 CT scan of the chest that I personally visualized shows continued persistence of the lingula and right middle lobe bronchiectasis and per radiology has progressed in 10 years.  She is not aware of having had etiologic workup.  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     Results for Kristy [name] (MRN [id number]) as of 12/28/2017 10:23  Ref. Range 02/27/2017 12:40  Sed Rate   Latest Ref Range: 0 - 30 mm/hr 3  [name] IgE Latest Units: kU/L <0.10  Pecan/Hickory Tree IgE Latest Units: kU/L <0.10  IgE (Immunoglobulin E), Serum Latest Ref Range: <115 kU/L 11  Allergen, D pternoyssinus,d7 Latest Units: kU/L <0.10  Cat Dander Latest Units: kU/L 0.17 (H)  Dog Dander Latest Units: kU/L <0.10  [place] Grass Latest Units: kU/L <0.10  Johnson Grass Latest Units: kU/L 0.14 (H)  Timothy Grass Latest Units: kU/L 1.05 (H)  Cockroach Latest Units: kU/L <0.10  Aspergillus fumigatus, m3 Latest Units: kU/L <0.10  Allergen, Comm Silver Wendee Copp, t9 Latest Units: kU/L <0.10  Allergen, Cottonwood, t14 Latest  Units: kU/L <0.10  Elm IgE Latest Units: kU/L <0.10  Allergen, Mulberry, t76 Latest Units: kU/L <0.10  Allergen, Oak,t7 Latest Units: kU/L <0.10  Common Ragweed Latest Units: kU/L 0.43 (H)  Allergen, Mouse Urine Protein, e78 Latest Units: kU/L <0.10  D. farinae Latest Units: kU/L <0.10  Allergen, Cedar tree, t12 Latest Units: kU/L <0.10  Box Elder IgE Latest Units: kU/L <0.10  Rough Pigweed  IgE Latest Units: kU/L <0.10   Results for RODOLFO, NOTARO (MRN [id number]) as of 12/28/2017 10:51  Ref. Range 02/27/2017 12:23 02/27/2017 12:40  Nitric Oxide Unknown 26       OV 01/27/2018  Chief Complaint  Patient presents with  .  Follow-up    Feeling better coughing up less mucus ,flutter vaule is very useful.    Follow-up bronchiectasis  This visit is to review etiologic workup but she only had immunoglobulin blood test which is normal. For some unclear reason our orders for point from gold, alpha-1 and autoimmune antibodies did not go through. This will have to be repeated. She did have pulmonary function testing and this shows moderate obstruction/restriction. Currently with [facility] and last time I added Mucinex along with flutter valve have improved his symptoms significantly. Her COPD cat score is 7 showing minimal level symptoms. She is willing to try added on Spiriva as a sample to see if she can make further inroads into her symptoms. Review of the chart shows 2018 CT scan of the chest done in the midst of an exacerbation was much worse with bronchiectasis compared to 10 years ago. She's not sure this is accurate. She is open to having a repeat CT scan done. She has many questions were bronchiectasis and management . CAT COPD Symptom & Quality of Life Score (GSK trademark) 0 is no burden. 5 is highest burden 01/27/2018   Never Cough -> Cough all the time 1  No phlegm in chest -> Chest is full of phlegm 2  No chest tightness -> Chest feels very tight 0  No dyspnea for 1 flight stairs/hill  -> Very dyspneic for 1 flight of stairs 2  No limitations for ADL at home -> Very limited with ADL at home 0  Confident leaving home -> Not at all confident leaving home 0  Sleep soundly -> Do not sleep soundly because of lung condition 0  Lots of Energy -> No energy at all 2  TOTAL Score (max 40)  7    Results for LARETTA, PYATT (MRN [id number]) as of 01/27/2018 11:28  Ref. Range 12/28/2017 11:26  Immunoglobulin A Latest Ref Range: 81 - 463 mg/dL 168  IgG (Immunoglobin G), Serum Latest Ref Range: 694 - 1,618 mg/dL 980   Results for SHAWNTEL, FARNWORTH (MRN [id number]) as of 01/27/2018 11:28  Ref. Range 01/27/2018 09:40  FVC-Post Latest Units: L 1.62  FVC-%Pred-Post Latest Units: % 59  FVC-%Change-Post Latest Units: % -4  FEV1-Post Latest Units: L 1.30  FEV1-%Pred-Post Latest Units: % 64  FEV1-%Change-Post Latest Units: % 0  Post FEV1/FVC ratio Latest Units: % 81  Results for CONLEY, PAWLING (MRN [id number]) as of 01/27/2018 11:28  Ref. Range 01/27/2018 09:40  TLC Latest Units: L 4.02  TLC % pred Latest Units: % 80  Results for VERNETA, HAMIDI (MRN [id number]) as of 01/27/2018 11:28  Ref. Range 01/27/2018 09:40  DLCO unc Latest Units: ml/min/mmHg 18.85  DLCO unc % pred Latest Units: % 78    has a past medical history of Asthma.   reports that she has never smoked. She has never used smokeless tobacco.  Past Surgical History:  Procedure Laterality Date  . APPENDECTOMY    . hesterectomy    . MASTECTOMY Bilateral     Allergies  Allergen Reactions  . Avelox [Moxifloxacin Hcl In Nacl]   . Ciprofloxacin   . Doxycycline Diarrhea  . Floxin [Ofloxacin]   . Iohexol      Code: HIVES, Desc: PER [facility] [place] @ Kristy SOOD OFFICE, 05/04/07 (PT IS ALLERGIC TO CONTRAST) CHANGE TO W/O CM. RM, Onset Date: [id number]   . Levaquin [Levofloxacin In D5w]   . Maxaquin [Lomefloxacin]   . Noroxin [Norfloxacin]   . Quinolones   . Tequin [Gatifloxacin]   .  Trovan [Alatrofloxacin]   . Zagam [Sparfloxacin]      Immunization History  Administered Date(s) Administered  . Influenza, High Dose Seasonal PF 07/22/2017  . Pneumococcal Conjugate-13 10/08/2014  . Pneumococcal Polysaccharide-23 10/20/2007, 05/19/2012    Family History  Problem Relation Age of Onset  . Allergic rhinitis Neg Hx   . Angioedema Neg Hx   . Asthma Neg Hx   . Atopy Neg Hx   . Eczema Neg Hx   . Immunodeficiency Neg Hx   . Urticaria Neg Hx      Current Outpatient Medications:  .  B Complex Vitamins (B COMPLEX PO), Take by mouth daily., Disp: , Rfl:  .  BIOTIN PO, Take 1 capsule by mouth daily., Disp: , Rfl:  .  Borage, Borago officinalis, (BORAGE OIL) 1000 MG CAPS, Take 1 capsule by mouth daily., Disp: , Rfl:  .  Calcium Carbonate (CALCIUM 600 PO), Take 1 tablet by mouth 2 (two) times daily., Disp: , Rfl:  .  diphenhydrAMINE (ALLERGY RELIEF) 25 mg capsule, Take 25 mg by mouth at bedtime., Disp: , Rfl:  .  Estradiol (VAGIFEM) 10 MCG TABS vaginal tablet, Place 10 mcg vaginally 2 (two) times a week., Disp: , Rfl:  .  ezetimibe (ZETIA) 10 MG tablet, Take 10 mg by mouth daily., Disp: , Rfl:  .  HAWTHORN EXTRACT PO, Take 600 mg by mouth daily., Disp: , Rfl:  .  MAGNESIUM PO, Take by mouth daily., Disp: , Rfl:  .  mometasone-formoterol (DULERA) 200-5 MCG/ACT AERO, Inhale 2 puffs into the lungs 2 (two) times daily., Disp: , Rfl:  .  OVER THE COUNTER MEDICATION, Take 1 capsule by mouth daily., Disp: , Rfl:  .  Respiratory Therapy Supplies (FLUTTER) DEVI, USE 3-4 TIMES DAILY AS NEEDED., Disp: 1 each, Rfl: 0   Review of Systems     Objective:   Physical Exam  Constitutional: She is oriented to person, place, and time. She appears well-developed and well-nourished. No distress.  HENT:  Head: Normocephalic and atraumatic.  Right Ear: External ear normal.  Left Ear: External ear normal.  Mouth/Throat: Oropharynx is clear and moist. No oropharyngeal exudate.  Eyes: Pupils are equal, round, and reactive to light.  Conjunctivae and EOM are normal. Right eye exhibits no discharge. Left eye exhibits no discharge. No scleral icterus.  Neck: Normal range of motion. Neck supple. No JVD present. No tracheal deviation present. No thyromegaly present.  Cardiovascular: Normal rate, regular rhythm, normal heart sounds and intact distal pulses. Exam reveals no gallop and no friction rub.  No murmur heard. Pulmonary/Chest: Effort normal and breath sounds normal. No respiratory distress. She has no wheezes. She has no rales. She exhibits no tenderness.  Abdominal: Soft. Bowel sounds are normal. She exhibits no distension and no mass. There is no tenderness. There is no rebound and no guarding.  Musculoskeletal: Normal range of motion. She exhibits no edema or tenderness.  Lymphadenopathy:    She has no cervical adenopathy.  Neurological: She is alert and oriented to person, place, and time. She has normal reflexes. No cranial nerve deficit. She exhibits normal muscle tone. Coordination normal.  Skin: Skin is warm and dry. No rash noted. She is not diaphoretic. No erythema. No pallor.  Psychiatric: She has a normal mood and affect. Her behavior is normal. Judgment and thought content normal.  Vitals reviewed.  Vitals:   01/27/18 1057 01/27/18 1058  BP:  (!) 144/82  Pulse:  76  SpO2:  97%  Weight: 119  lb 12.8 oz (54.3 kg)   Height: 5' 3.75" (1.619 m)     Estimated body mass index is 20.73 kg/m as calculated from the following:   Height as of this encounter: 5' 3.75" (1.619 m).   Weight as of this encounter: 119 lb 12.8 oz (54.3 kg).     Assessment:       ICD-10-CM   1. Bronchiectasis without complication ([place]) Y86.5        Plan:      Bronchiectasis since 2008  Lung funciton is around 60% because of this Symptom burden is minimal and correlates with lung function Glad flutter valvue +mucinex caused improvement Glad ongoing dulera is helping  Plan -= Last time for some reason some of the causative  blood work not done - my apologiesk  - check alpha 1, Quanitferon Gold, ANA, DS DNA, Ccp, SSa/ssB, SCL-70  - start flutter valve 10 times daily -- continue  mucinex tablets - take samples, and discount card - continue dulera for now;  - add spiriva - take samples and give [place] feed back in a month  - if is helping can add as regular medication - do CT chest without contrast in [place] next few weeks - 1 year followup esp because 2018 CT said is worse than 2008 - will continue above care but if lung function/CT declined or repeated flare ups will consider bronchoscopy  Followu 6 months or sooner if needed   > 50% of this > 25 min visit spent in face to face counseling or coordination of care   Kristy. Brand Males, M.D., [facility].C.P Pulmonary and Critical Care Medicine Staff Physician, [place] Director - Interstitial Lung Disease  Program  Pulmonary [place] [facility] [place], [place], [address]  Pager: [phone], If no answer or between  15:00h - 7:00h: call [phone] Telephone: [phone]

## 2018-01-27 NOTE — Patient Instructions (Addendum)
ICD-10-CM   1. Bronchiectasis without complication ([place]) I95.1     Bronchiectasis since 2008  Lung funciton is around 60% because of this Symptom burden is minimal and correlates with lung function Glad flutter valvue +mucinex caused improvement Glad ongoing dulera is helping  Plan -= Last time for some reason some of the causative blood work not done - my apologiesk  - check alpha 1, Quanitferon Gold, ANA, DS DNA, Ccp, SSa/ssB, SCL-70  - start flutter valve 10 times daily -- continue  mucinex tablets - take samples, and discount card - continue dulera for now;  - add spiriva - take samples and give [place] feed back in a month  - if is helping can add as regular medication - do CT chest without contrast in [place] next few weeks - 1 year followup esp because 2018 CT said is worse than 2008 - will continue above care but if lung function/CT declined or repeated flare ups will consider bronchoscopy  Followu 6 months or sooner if needed

## 2018-01-28 LAB — ANTI-SCLERODERMA ANTIBODY: Scleroderma (Scl-70) (ENA) Antibody, IgG: <1 AI

## 2018-01-28 LAB — SJOGREN'S SYNDROME ANTIBODS(SSA + SSB)
SSA (Ro) (ENA) Antibody, IgG: <1 AI
SSB (La) (ENA) Antibody, IgG: <1 AI

## 2018-01-28 LAB — ANTI-DNA ANTIBODY, DOUBLE-STRANDED: ds DNA Ab: <1 IU/mL

## 2018-02-01 LAB — ANA: ANA: NEGATIVE

## 2018-02-02 LAB — ALPHA-1 ANTITRYPSIN PHENOTYPE: A1 ANTITRYPSIN SER: 156 mg/dL (ref 83–199)

## 2018-02-04 ENCOUNTER — Ambulatory Visit (INDEPENDENT_AMBULATORY_CARE_PROVIDER_SITE_OTHER)
Admission: RE | Admit: 2018-02-04 | Discharge: 2018-02-04 | Disposition: A | Discharge status: Home / Self Care | Payer: Medicare Other | Source: Ambulatory Visit | Attending: Internal Medicine | Admitting: Internal Medicine

## 2018-02-04 DIAGNOSIS — J479 Bronchiectasis, uncomplicated: Secondary | ICD-10-CM | POA: Diagnosis not present

## 2018-02-23 ENCOUNTER — Telehealth: Payer: Self-pay | Admitting: Internal Medicine

## 2018-02-23 DIAGNOSIS — R911 Solitary pulmonary nodule: Secondary | ICD-10-CM

## 2018-02-23 NOTE — Telephone Encounter (Signed)
Let Kristy [name] know that CT chest is stable fro bronchiectasis stand point but there is a new 30mm nodule - likely inflammatory in RLL 33mm - recommend reepat CT chest without contrast in  3 months    IMPRESSION: 1. No substantial interval change in exam. There is some minimal waxing and waning to the appearance of the diffuse bilateral lung disease suggesting atypical infection. Subtle area of tree-in-bud nodularity in the posterior left lower lobe has improved but there is new nodularity in the right lower lobe measuring up to 10 mm. While this new right lower lobe nodularity is most likely related to the more diffuse process, follow-up chest CT in 3 months is recommended to ensure no progression. 2.  Aortic Atherosclerois (ICD10-170.0)   Electronically Signed   By: Verda Cumins.D.

## 2018-03-03 NOTE — Telephone Encounter (Signed)
Spoke with pt, aware of results/recs.  Follow up CT ordered.  Nothing further needed.

## 2018-03-09 DIAGNOSIS — Z79899 Other long term (current) drug therapy: Secondary | ICD-10-CM | POA: Diagnosis not present

## 2018-03-09 DIAGNOSIS — E782 Mixed hyperlipidemia: Secondary | ICD-10-CM | POA: Diagnosis not present

## 2018-03-24 DIAGNOSIS — Z23 Encounter for immunization: Secondary | ICD-10-CM | POA: Diagnosis not present

## 2018-03-24 DIAGNOSIS — S61219A Laceration without foreign body of unspecified finger without damage to nail, initial encounter: Secondary | ICD-10-CM | POA: Diagnosis not present

## 2018-03-24 DIAGNOSIS — J4530 Mild persistent asthma, uncomplicated: Secondary | ICD-10-CM | POA: Diagnosis not present

## 2018-03-24 DIAGNOSIS — E782 Mixed hyperlipidemia: Secondary | ICD-10-CM | POA: Diagnosis not present

## 2018-05-07 DIAGNOSIS — H52223 Regular astigmatism, bilateral: Secondary | ICD-10-CM | POA: Diagnosis not present

## 2018-05-07 DIAGNOSIS — H31091 Other chorioretinal scars, right eye: Secondary | ICD-10-CM | POA: Diagnosis not present

## 2018-05-07 DIAGNOSIS — H43813 Vitreous degeneration, bilateral: Secondary | ICD-10-CM | POA: Diagnosis not present

## 2018-05-07 DIAGNOSIS — H524 Presbyopia: Secondary | ICD-10-CM | POA: Diagnosis not present

## 2018-05-11 ENCOUNTER — Ambulatory Visit (INDEPENDENT_AMBULATORY_CARE_PROVIDER_SITE_OTHER)
Admission: RE | Admit: 2018-05-11 | Discharge: 2018-05-11 | Disposition: A | Discharge status: Home / Self Care | Payer: Medicare Other | Source: Ambulatory Visit | Attending: Internal Medicine | Admitting: Internal Medicine

## 2018-05-11 DIAGNOSIS — R911 Solitary pulmonary nodule: Secondary | ICD-10-CM

## 2018-07-09 DIAGNOSIS — Z23 Encounter for immunization: Secondary | ICD-10-CM | POA: Diagnosis not present

## 2018-08-10 ENCOUNTER — Ambulatory Visit (INDEPENDENT_AMBULATORY_CARE_PROVIDER_SITE_OTHER): Payer: Medicare Other | Admitting: Internal Medicine

## 2018-08-10 ENCOUNTER — Encounter: Payer: Self-pay | Admitting: Internal Medicine

## 2018-08-10 VITALS — BP 136/84 | HR 75 | Ht 63.75 in | Wt 121.0 lb

## 2018-08-10 DIAGNOSIS — J479 Bronchiectasis, uncomplicated: Secondary | ICD-10-CM | POA: Diagnosis not present

## 2018-08-10 NOTE — Patient Instructions (Addendum)
ICD-10-CM   1. Bronchiectasis without complication ([place]) Y40.3    Stable bronchiectasis without flareup Lung nodule in May 2019 has resolved in August 2019 8 CT chest Respect the fact you did not want to take Spiriva because of potential hair loss situation Glad you are up-to-date with flu shot  PLAN - Please talk to PCP Ronita Hipps, MD -  and ensure you get  shingrix ([place]) inactivated vaccine against shingles  -Refer to pulmonary rehabilitation at [facility] 2 puffs 2 times daily  -Continue flutter valve  -Respect fact Mucinex is not working for you because of colitis  Follow-up -In 6 months do spirometry pre-and postbronchodilator with DLCO -Return to see Dr. Chase Caller in 6 months or sooner if needed

## 2018-08-10 NOTE — Progress Notes (Signed)
HPI  Brief patient profile:  36 yowf never smoker with lots of passive esposure with new onset wheezing mid 1990s  eval by Dr Bernita Buffy dx with cat /dog/grass rx with allergy shots x several years better s other treatments needed but still got "bronchitis" several times per year rx pred/abx no need for regular inhalers >  eval by [place] when Dr Lorin Picket Retired rec dulera and did better until around 2010 started coughing after supper when took losartan but better p stopped losartan as of March 2018 and referred to pulmonary clinic 02/27/2017 by Dr   Helene Kelp for abn ct chest done as part of the work up cough     History of Present Illness  02/27/2017 1st [facility] office visit/ [facility]  Re bronchiectasis Chief Complaint  Patient presents with  . Pulmonary Consult    Referred by Dr. Yetta Barre. Pt c/o cough for the past 8 yrs.    cough has present for 8 years, better since March 2018, cough some every 24 hours seems worse after supper  Takes dulera 200 2 puffs in am and then again after taking pm dose of dulera starts coughing  Most productive p supper with clear mucus  "95% better p losartan stopped so that's not why she's here"  Not limited by breathing from desired activities   rec Plan A = Automatic =  dulera 100 Take 2 puffs first thing in am and then another 2 puffs about 12 hours later.  Plan B = Backup Only use your albuterol [name])    Try prilosec otc 20mg   Take 30-60 min before first meal of the day and [name] 20 mg one right after supper  until  Return GERD     04/10/2017  f/u ov/Wert re: obst  Bronchiectasis  maint on dulera 100 2bid / gerd rx  Chief Complaint  Patient presents with  . Follow-up    Cough has improved- coughing less and not producing less sputum, yellow in color.      Not limited by breathing from desired activities    No obvious day to day or daytime variability or assoc excess/ purulent sputum or mucus plugs or hemoptysis or cp or  chest tightness, subjective wheeze or overt sinus or hb symptoms. No unusual exp hx or h/o childhood pna/ asthma or knowledge of premature birth.  Sleeping ok without nocturnal  or early am exacerbation  of respiratory  c/o's or need for noct saba. Also denies any obvious fluctuation of symptoms with weather or environmental changes or other aggravating or alleviating factors except as outlined above    OV 12/28/2017  Chief Complaint  Patient presents with  . Consult    Switching from MW to MR due to obstructive bronchiectasis.  Pt states she was diagnosed at [facility] with obstructive bronchiectasis 10/2016 but is unsure if that is what she has. Pt states at times, she has c/o SOB, cough with yellow to green mucus, and CP.  Pt states she has an occ. wheeze and has to try to cough hard to be able to get mucus up.    77 year old female from [place], [place] here for transfer of care from Dr. Melvyn Novas to Dr. Chase Caller for bronchiectasis  Patient tells me that she is not sure about the  diagnosis of bronchiectasis and wants to make sure she has this diagnosis.  And at that time it was only nocturnal cough.  It was associated with [name] usage.  Around this time he tells me that she started with chronic cough some 15 years ago.  The [name] was stopped and the cough improved then several years later approximately 10 years ago the cough returned and again it was a nocturnal cough associated with sputum production particularly at night.  The amount of sputum she brings out since then is around one third of a cup and it is discolored with white yellowish color.  Occasionally she will flareup requiring antibiotic/prednisone.  But for most of the time daytime she is fine except in the evenings and at night she starts having significant amount of cough and she has to strain to bring up the sputum.  She had a CT scan of the chest in 2008 a personally visualized that showed bilateral lingula and right  middle lobe bronchiectasis but she is unaware of this diagnosis back then.  She tells me that approximately 1 year ago she got hospitalized at [facility] and treated for bronchiectasis exacerbation [initially there was confusion whether she might have a pulmonary embolism] and around this time her losartan was stopped and with this the cough improved.  She has been on [facility] for several years which only helps her shortness of breath but not the mucus production.  Nevertheless overall she continues to have not evening mucus production with the cough and this happens every day and it is affecting her quality of life by preventing going to the church.  She has had allergy workup as above and below and it is negative so far.  2018 CT scan of the chest that I personally visualized shows continued persistence of the lingula and right middle lobe bronchiectasis and per radiology has progressed in 10 years.  She is not aware of having had etiologic workup.  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     Results for Kristy, [name] (MRN [id number]) as of 12/28/2017 10:23  Ref. Range 02/27/2017 12:40  Sed Rate   Latest Ref Range: 0 - 30 mm/hr 3  [name] IgE Latest Units: kU/L <0.10  Pecan/Hickory Tree IgE Latest Units: kU/L <0.10  IgE (Immunoglobulin E), Serum Latest Ref Range: <115 kU/L 11  Allergen, D pternoyssinus,d7 Latest Units: kU/L <0.10  Cat Dander Latest Units: kU/L 0.17 (H)  Dog Dander Latest Units: kU/L <0.10  [place] Grass Latest Units: kU/L <0.10  Johnson Grass Latest Units: kU/L 0.14 (H)  Timothy Grass Latest Units: kU/L 1.05 (H)  Cockroach Latest Units: kU/L <0.10  Aspergillus fumigatus, m3 Latest Units: kU/L <0.10  Allergen, Comm Silver Wendee Copp, t9 Latest Units: kU/L <0.10  Allergen, Cottonwood, t14 Latest Units: kU/L <0.10  Elm IgE Latest Units: kU/L <0.10  Allergen, Mulberry, t76 Latest Units:  kU/L <0.10  Allergen, Oak,t7 Latest Units: kU/L <0.10  Common Ragweed Latest Units: kU/L 0.43 (H)  Allergen, Mouse Urine Protein, e78 Latest Units: kU/L <0.10  D. farinae Latest Units: kU/L <0.10  Allergen, Cedar tree, t12 Latest Units: kU/L <0.10  Box Elder IgE Latest Units: kU/L <0.10  Rough Pigweed  IgE Latest Units: kU/L <0.10   Results for Kristy, [name] (MRN [id number]) as of 12/28/2017 10:51  Ref. Range 02/27/2017 12:23 02/27/2017 12:40  Nitric Oxide Unknown 26       OV 01/27/2018  Chief Complaint  Patient presents with  . Follow-up    Feeling better coughing up less mucus ,flutter vaule is very useful.    Follow-up  bronchiectasis  This visit is to review etiologic workup but she only had immunoglobulin blood test which is normal. For some unclear reason our orders for point from gold, alpha-1 and autoimmune antibodies did not go through. This will have to be repeated. She did have pulmonary function testing and this shows moderate obstruction/restriction. Currently with [facility] and last time I added Mucinex along with flutter valve have improved his symptoms significantly. Her COPD cat score is 7 showing minimal level symptoms. She is willing to try added on Spiriva as a sample to see if she can make further inroads into her symptoms. Review of the chart shows 2018 CT scan of the chest done in the midst of an exacerbation was much worse with bronchiectasis compared to 10 years ago. She's not sure this is accurate. She is open to having a repeat CT scan done. She has many questions were bronchiectasis and management . Results for Kristy, [name] (MRN [id number]) as of 01/27/2018 11:28  Ref. Range 12/28/2017 11:26  Immunoglobulin A Latest Ref Range: 81 - 463 mg/dL 168  IgG (Immunoglobin G), Serum Latest Ref Range: 694 - 1,618 mg/dL 980   Results for Kristy, [name] (MRN [id number]) as of 01/27/2018 11:28  Ref. Range 01/27/2018 09:40  FVC-Post Latest Units: L 1.62  FVC-%Pred-Post  Latest Units: % 59  FVC-%Change-Post Latest Units: % -4  FEV1-Post Latest Units: L 1.30  FEV1-%Pred-Post Latest Units: % 64  FEV1-%Change-Post Latest Units: % 0  Post FEV1/FVC ratio Latest Units: % 81  Results for Kristy, [name] (MRN [id number]) as of 01/27/2018 11:28  Ref. Range 01/27/2018 09:40  TLC Latest Units: L 4.02  TLC % pred Latest Units: % 80  Results for Kristy, PICINICH (MRN [id number]) as of 01/27/2018 11:28  Ref. Range 01/27/2018 09:40  DLCO unc Latest Units: ml/min/mmHg 18.85  DLCO unc % pred Latest Units: % 78    OV 08/10/2018  Subjective:  Patient ID: Kristy [name], female , DOB: [birth year] , age 77 y.o. , MRN: [id number] , ADDRESS: [address] [place] [address]   08/10/2018 -   Chief Complaint  Patient presents with  . Follow-up    pt states she is at baseline, does note prod cough with brown/green/clear mucus.  cough is worse after dinner.  Didn't start spiriva d/t side effects.      HPI [place] 77 y.o. -follow bronchiectasis.  Last visit she was doing well with Mucinex but then she stopped the Mucinex because of abdominal cramps.  I recommended Spiriva was added to her Ruthe Mannan but because of fear of hair loss which she looked up on the Internet she did not take it.  She is adamant she will not give Spiriva trial.  Overall she feels stable although the CAT score she is reporting increased symptomatology but she and says she is been stable since the spring 2019.  In the interim she has had 2 CT scans of the chest in May 2019 and August 2019.  Her lung nodule has resolved.  Bronchiectasis is stable.  Radiology is not recommending any further follow-up.  She is up-to-date with her flu shot and pneumonia vaccines.  Her main issue is that she has significant shortness of breath climbing stairs.  She is a 7 years ago she desaturated from 98% to 93%.  This has not been checked in a while.  [facility] has never been to pulmonary  rehabilitation and is open to attending that locally.  CAT COPD Symptom & Quality of Life Score (GSK trademark) 0 is no burden. 5 is highest burden 01/27/2018  08/10/2018   Never Cough -> Cough all the time 1 3  No phlegm in chest -> Chest is full of phlegm 2 3  No chest tightness -> Chest feels very tight 0 2  No dyspnea for 1 flight stairs/hill -> Very dyspneic for 1 flight of stairs 2 5  No limitations for ADL at home -> Very limited with ADL at home 0 0  Confident leaving home -> Not at all confident leaving home 0 1  Sleep soundly -> Do not sleep soundly because of lung condition 0 1  Lots of Energy -> No energy at all 2 1  TOTAL Score (max 40)  7 16    ROS - per HPI     has a past medical history of Asthma.   reports that she has never smoked. She has never used smokeless tobacco.  Past Surgical History:  Procedure Laterality Date  . APPENDECTOMY    . hesterectomy    . MASTECTOMY Bilateral     Allergies  Allergen Reactions  . Avelox [Moxifloxacin Hcl In Nacl]   . Ciprofloxacin   . Doxycycline Diarrhea  . Floxin [Ofloxacin]   . Iohexol      Code: HIVES, Desc: PER RHON[facility] [place] @ DR SOOD OFFICE, 05/04/07 (PT IS ALLERGIC TO CONTRAST) CHANGE TO W/O CM. RM, Onset Date: [id number]   . Levaquin [Levofloxacin In D5w]   . Maxaquin [Lomefloxacin]   . Noroxin [Norfloxacin]   . Quinolones   . Tequin [Gatifloxacin]   . Trovan [Alatrofloxacin]   . Zagam [Sparfloxacin]     Immunization History  Administered Date(s) Administered  . Influenza, High Dose Seasonal PF 07/22/2017, 07/09/2018  . Pneumococcal Conjugate-13 10/08/2014  . Pneumococcal Polysaccharide-23 10/20/2007, 05/19/2012    Family History  Problem Relation Age of Onset  . Allergic rhinitis Neg Hx   . Angioedema Neg Hx   . Asthma Neg Hx   . Atopy Neg Hx   . Eczema Neg Hx   . Immunodeficiency Neg Hx   . Urticaria Neg Hx      Current Outpatient Medications:  .  B Complex Vitamins (B COMPLEX PO), Take  by mouth daily., Disp: , Rfl:  .  BIOTIN PO, Take 1 capsule by mouth daily., Disp: , Rfl:  .  Borage, Borago officinalis, (BORAGE OIL) 1000 MG CAPS, Take 1 capsule by mouth daily., Disp: , Rfl:  .  Calcium Carbonate (CALCIUM 600 PO), Take 1 tablet by mouth 2 (two) times daily., Disp: , Rfl:  .  diphenhydrAMINE (ALLERGY RELIEF) 25 mg capsule, Take 25 mg by mouth at bedtime., Disp: , Rfl:  .  Estradiol (VAGIFEM) 10 MCG TABS vaginal tablet, Place 10 mcg vaginally 2 (two) times a week., Disp: , Rfl:  .  ezetimibe (ZETIA) 10 MG tablet, Take 10 mg by mouth daily., Disp: , Rfl:  .  HAWTHORN EXTRACT PO, Take 600 mg by mouth daily., Disp: , Rfl:  .  MAGNESIUM PO, Take by mouth daily., Disp: , Rfl:  .  mometasone-formoterol (DULERA) 200-5 MCG/ACT AERO, Inhale 2 puffs into the lungs 2 (two) times daily., Disp: , Rfl:  .  OVER THE COUNTER MEDICATION, Take 1 capsule by mouth daily., Disp: , Rfl:  .  Respiratory Therapy Supplies (FLUTTER) DEVI, USE 3-4 TIMES DAILY AS NEEDED., Disp: 1 each, Rfl: 0      Objective:   Vitals:  08/10/18 1004  BP: 136/84  Pulse: 75  SpO2: 98%  Weight: 121 lb (54.9 kg)  Height: 5' 3.75" (1.619 m)    Estimated body mass index is 20.93 kg/m as calculated from the following:   Height as of this encounter: 5' 3.75" (1.619 m).   Weight as of this encounter: 121 lb (54.9 kg).  @WEIGHTCHANGE @  [employer]   08/10/18 1004  Weight: 121 lb (54.9 kg)     Physical Exam  General Appearance:    Alert, cooperative, no distress, appears stated age - yes , Deconditioned looking - no , OBESE  - no, Sitting on Wheelchair -  no  Head:    Normocephalic, without obvious abnormality, atraumatic  Eyes:    PERRL, conjunctiva/corneas clear,  Ears:    Normal TM's and external ear canals, both ears  Nose:   Nares normal, septum midline, mucosa normal, no drainage    or sinus tenderness. OXYGEN ON  - no . Patient is @ ra   Throat:   Lips, mucosa, and tongue normal; teeth and gums  normal. Cyanosis on lips - no  Neck:   Supple, symmetrical, trachea midline, no adenopathy;    thyroid:  no enlargement/tenderness/nodules; no carotid   bruit or JVD  Back:     Symmetric, no curvature, ROM normal, no CVA tenderness  Lungs:     Distress - no , Wheeze no, Barrell Chest - no, Purse lip breathing - mild yes, Crackles - no   Chest Wall:    No tenderness or deformity.    Heart:    Regular rate and rhythm, S1 and S2 normal, no rub   or gallop, Murmur - no  Breast Exam:    NOT DONE  Abdomen:     Soft, non-tender, bowel sounds active all four quadrants,    no masses, no organomegaly. Visceral obesity - no  Genitalia:   NOT DONE  Rectal:   NOT DONE  Extremities:   Extremities - normal, Has Cane - no, Clubbing - no, Edema - no  Pulses:   2+ and symmetric all extremities  Skin:   Stigmata of Connective Tissue Disease - no  Lymph nodes:   Cervical, supraclavicular, and axillary nodes normal  Psychiatric:  Neurologic:   Pleasant - yes, Anxious - no, Flat affect - no  CAm-ICU - neg, Alert and Oriented x 3 - yes, Moves all 4s - yes, Speech - normal, Cognition - intact           Assessment:       ICD-10-CM   1. Bronchiectasis without complication ([place]) Q67.6        Plan:     Patient Instructions     ICD-10-CM   1. Bronchiectasis without complication ([place]) P95.0    Stable bronchiectasis without flareup Lung nodule in May 2019 has resolved in August 2019 8 CT chest Respect the fact you did not want to take Spiriva because of potential hair loss situation Glad you are up-to-date with flu shot  PLAN - Please talk to PCP Ronita Hipps, MD -  and ensure you get  shingrix ([place]) inactivated vaccine against shingles  -Refer to pulmonary rehabilitation at [facility] 2 puffs 2 times daily  -Continue flutter valve  -Respect fact Mucinex is not working for you because of colitis  Follow-up -In 6 months do spirometry pre-and postbronchodilator with  DLCO -Return to see Dr. Chase Caller in 6 months or sooner if needed  SIGNATURE    Dr. Brand Males, M.D., F.C.C.P,  Pulmonary and Critical Care Medicine Staff Physician, [place] Director - Interstitial Lung Disease  Program  Pulmonary [place] at [place], [place], [address]  Pager: [phone], If no answer or between  15:00h - 7:00h: call [phone] Telephone: [phone]  10:33 AM 08/10/2018

## 2018-08-18 DIAGNOSIS — L821 Other seborrheic keratosis: Secondary | ICD-10-CM | POA: Diagnosis not present

## 2018-08-18 DIAGNOSIS — D1801 Hemangioma of skin and subcutaneous tissue: Secondary | ICD-10-CM | POA: Diagnosis not present

## 2018-08-18 DIAGNOSIS — Z85820 Personal history of malignant melanoma of skin: Secondary | ICD-10-CM | POA: Diagnosis not present

## 2018-08-24 ENCOUNTER — Telehealth: Payer: Self-pay | Admitting: Internal Medicine

## 2018-08-24 DIAGNOSIS — J470 Bronchiectasis with acute lower respiratory infection: Secondary | ICD-10-CM | POA: Diagnosis not present

## 2018-08-24 NOTE — Telephone Encounter (Signed)
Called and spoke to rehab I have printed records and have faxed them to the office

## 2018-08-24 NOTE — Telephone Encounter (Signed)
Called and spoke with Judeen Hammans from pulmonary rehab and gave her the number to medical records for the patients medical records. She verbalized understanding.   Judeen Hammans also stated that they have not received the referral for this patient to begin. I see where the referral was sent over on 08/10/18.   [facility] is this something you can help with, thank you.

## 2018-08-25 ENCOUNTER — Other Ambulatory Visit: Payer: Self-pay

## 2018-08-25 DIAGNOSIS — J470 Bronchiectasis with acute lower respiratory infection: Secondary | ICD-10-CM | POA: Diagnosis not present

## 2018-08-27 DIAGNOSIS — J470 Bronchiectasis with acute lower respiratory infection: Secondary | ICD-10-CM | POA: Diagnosis not present

## 2018-08-30 DIAGNOSIS — J470 Bronchiectasis with acute lower respiratory infection: Secondary | ICD-10-CM | POA: Diagnosis not present

## 2018-08-31 DIAGNOSIS — J470 Bronchiectasis with acute lower respiratory infection: Secondary | ICD-10-CM | POA: Diagnosis not present

## 2018-09-01 DIAGNOSIS — J470 Bronchiectasis with acute lower respiratory infection: Secondary | ICD-10-CM | POA: Diagnosis not present

## 2018-09-06 DIAGNOSIS — J479 Bronchiectasis, uncomplicated: Secondary | ICD-10-CM | POA: Diagnosis not present

## 2018-09-08 DIAGNOSIS — J479 Bronchiectasis, uncomplicated: Secondary | ICD-10-CM | POA: Diagnosis not present

## 2018-09-10 DIAGNOSIS — J479 Bronchiectasis, uncomplicated: Secondary | ICD-10-CM | POA: Diagnosis not present

## 2018-09-13 DIAGNOSIS — J479 Bronchiectasis, uncomplicated: Secondary | ICD-10-CM | POA: Diagnosis not present

## 2018-09-15 DIAGNOSIS — J479 Bronchiectasis, uncomplicated: Secondary | ICD-10-CM | POA: Diagnosis not present

## 2018-09-17 DIAGNOSIS — J479 Bronchiectasis, uncomplicated: Secondary | ICD-10-CM | POA: Diagnosis not present

## 2018-09-20 DIAGNOSIS — J479 Bronchiectasis, uncomplicated: Secondary | ICD-10-CM | POA: Diagnosis not present

## 2018-09-21 DIAGNOSIS — E782 Mixed hyperlipidemia: Secondary | ICD-10-CM | POA: Diagnosis not present

## 2018-09-21 DIAGNOSIS — Z79899 Other long term (current) drug therapy: Secondary | ICD-10-CM | POA: Diagnosis not present

## 2018-09-22 DIAGNOSIS — J479 Bronchiectasis, uncomplicated: Secondary | ICD-10-CM | POA: Diagnosis not present

## 2018-09-24 DIAGNOSIS — J479 Bronchiectasis, uncomplicated: Secondary | ICD-10-CM | POA: Diagnosis not present

## 2018-09-27 DIAGNOSIS — E782 Mixed hyperlipidemia: Secondary | ICD-10-CM | POA: Diagnosis not present

## 2018-09-27 DIAGNOSIS — Z6820 Body mass index (BMI) 20.0-20.9, adult: Secondary | ICD-10-CM | POA: Diagnosis not present

## 2018-09-27 DIAGNOSIS — Z1331 Encounter for screening for depression: Secondary | ICD-10-CM | POA: Diagnosis not present

## 2018-09-27 DIAGNOSIS — J479 Bronchiectasis, uncomplicated: Secondary | ICD-10-CM | POA: Diagnosis not present

## 2018-09-27 DIAGNOSIS — Z9181 History of falling: Secondary | ICD-10-CM | POA: Diagnosis not present

## 2018-10-01 DIAGNOSIS — J479 Bronchiectasis, uncomplicated: Secondary | ICD-10-CM | POA: Diagnosis not present

## 2018-10-04 DIAGNOSIS — J479 Bronchiectasis, uncomplicated: Secondary | ICD-10-CM | POA: Diagnosis not present

## 2018-10-05 DIAGNOSIS — J479 Bronchiectasis, uncomplicated: Secondary | ICD-10-CM | POA: Diagnosis not present

## 2018-10-11 DIAGNOSIS — J470 Bronchiectasis with acute lower respiratory infection: Secondary | ICD-10-CM | POA: Diagnosis not present

## 2018-10-12 DIAGNOSIS — K5792 Diverticulitis of intestine, part unspecified, without perforation or abscess without bleeding: Secondary | ICD-10-CM | POA: Diagnosis not present

## 2018-10-13 DIAGNOSIS — J470 Bronchiectasis with acute lower respiratory infection: Secondary | ICD-10-CM | POA: Diagnosis not present

## 2018-10-15 DIAGNOSIS — J470 Bronchiectasis with acute lower respiratory infection: Secondary | ICD-10-CM | POA: Diagnosis not present

## 2018-10-18 DIAGNOSIS — J470 Bronchiectasis with acute lower respiratory infection: Secondary | ICD-10-CM | POA: Diagnosis not present

## 2018-10-20 DIAGNOSIS — J470 Bronchiectasis with acute lower respiratory infection: Secondary | ICD-10-CM | POA: Diagnosis not present

## 2018-10-22 DIAGNOSIS — J470 Bronchiectasis with acute lower respiratory infection: Secondary | ICD-10-CM | POA: Diagnosis not present

## 2018-10-25 DIAGNOSIS — J470 Bronchiectasis with acute lower respiratory infection: Secondary | ICD-10-CM | POA: Diagnosis not present

## 2018-10-27 DIAGNOSIS — J470 Bronchiectasis with acute lower respiratory infection: Secondary | ICD-10-CM | POA: Diagnosis not present

## 2018-10-29 DIAGNOSIS — J470 Bronchiectasis with acute lower respiratory infection: Secondary | ICD-10-CM | POA: Diagnosis not present

## 2018-11-01 DIAGNOSIS — J470 Bronchiectasis with acute lower respiratory infection: Secondary | ICD-10-CM | POA: Diagnosis not present

## 2018-11-03 DIAGNOSIS — J470 Bronchiectasis with acute lower respiratory infection: Secondary | ICD-10-CM | POA: Diagnosis not present

## 2018-11-05 DIAGNOSIS — J470 Bronchiectasis with acute lower respiratory infection: Secondary | ICD-10-CM | POA: Diagnosis not present

## 2018-11-08 DIAGNOSIS — J479 Bronchiectasis, uncomplicated: Secondary | ICD-10-CM | POA: Diagnosis not present

## 2018-11-10 DIAGNOSIS — J479 Bronchiectasis, uncomplicated: Secondary | ICD-10-CM | POA: Diagnosis not present

## 2018-11-12 DIAGNOSIS — J479 Bronchiectasis, uncomplicated: Secondary | ICD-10-CM | POA: Diagnosis not present

## 2018-11-17 DIAGNOSIS — J479 Bronchiectasis, uncomplicated: Secondary | ICD-10-CM | POA: Diagnosis not present

## 2018-11-19 DIAGNOSIS — J479 Bronchiectasis, uncomplicated: Secondary | ICD-10-CM | POA: Diagnosis not present

## 2019-02-04 ENCOUNTER — Ambulatory Visit: Payer: Medicare Other | Admitting: Internal Medicine

## 2019-07-05 DIAGNOSIS — Z79899 Other long term (current) drug therapy: Secondary | ICD-10-CM | POA: Diagnosis not present

## 2019-07-05 DIAGNOSIS — E782 Mixed hyperlipidemia: Secondary | ICD-10-CM | POA: Diagnosis not present

## 2019-07-13 DIAGNOSIS — E782 Mixed hyperlipidemia: Secondary | ICD-10-CM | POA: Diagnosis not present

## 2019-07-13 DIAGNOSIS — Z23 Encounter for immunization: Secondary | ICD-10-CM | POA: Diagnosis not present

## 2019-07-13 DIAGNOSIS — Z0000 Encounter for general adult medical examination without abnormal findings: Secondary | ICD-10-CM | POA: Diagnosis not present

## 2019-07-13 DIAGNOSIS — Z1331 Encounter for screening for depression: Secondary | ICD-10-CM | POA: Diagnosis not present

## 2020-01-10 DIAGNOSIS — L820 Inflamed seborrheic keratosis: Secondary | ICD-10-CM | POA: Diagnosis not present

## 2020-01-10 DIAGNOSIS — D1801 Hemangioma of skin and subcutaneous tissue: Secondary | ICD-10-CM | POA: Diagnosis not present

## 2020-01-10 DIAGNOSIS — L821 Other seborrheic keratosis: Secondary | ICD-10-CM | POA: Diagnosis not present

## 2020-01-10 DIAGNOSIS — Z85820 Personal history of malignant melanoma of skin: Secondary | ICD-10-CM | POA: Diagnosis not present

## 2020-01-19 DIAGNOSIS — M545 Low back pain: Secondary | ICD-10-CM | POA: Diagnosis not present

## 2020-01-19 DIAGNOSIS — M9903 Segmental and somatic dysfunction of lumbar region: Secondary | ICD-10-CM | POA: Diagnosis not present

## 2020-01-19 DIAGNOSIS — M9902 Segmental and somatic dysfunction of thoracic region: Secondary | ICD-10-CM | POA: Diagnosis not present

## 2020-01-19 DIAGNOSIS — M9905 Segmental and somatic dysfunction of pelvic region: Secondary | ICD-10-CM | POA: Diagnosis not present

## 2020-01-25 DIAGNOSIS — E782 Mixed hyperlipidemia: Secondary | ICD-10-CM | POA: Diagnosis not present

## 2020-01-25 DIAGNOSIS — Z79899 Other long term (current) drug therapy: Secondary | ICD-10-CM | POA: Diagnosis not present

## 2020-01-31 DIAGNOSIS — J479 Bronchiectasis, uncomplicated: Secondary | ICD-10-CM | POA: Diagnosis not present

## 2020-01-31 DIAGNOSIS — Z7952 Long term (current) use of systemic steroids: Secondary | ICD-10-CM | POA: Diagnosis not present

## 2020-01-31 DIAGNOSIS — J45909 Unspecified asthma, uncomplicated: Secondary | ICD-10-CM | POA: Diagnosis not present

## 2020-01-31 DIAGNOSIS — Z79899 Other long term (current) drug therapy: Secondary | ICD-10-CM | POA: Diagnosis not present

## 2020-01-31 DIAGNOSIS — Z885 Allergy status to narcotic agent status: Secondary | ICD-10-CM | POA: Diagnosis not present

## 2020-01-31 DIAGNOSIS — Z888 Allergy status to other drugs, medicaments and biological substances status: Secondary | ICD-10-CM | POA: Diagnosis not present

## 2020-01-31 DIAGNOSIS — J471 Bronchiectasis with (acute) exacerbation: Secondary | ICD-10-CM | POA: Diagnosis not present

## 2020-01-31 DIAGNOSIS — J189 Pneumonia, unspecified organism: Secondary | ICD-10-CM | POA: Diagnosis not present

## 2020-01-31 DIAGNOSIS — Z853 Personal history of malignant neoplasm of breast: Secondary | ICD-10-CM | POA: Diagnosis not present

## 2020-01-31 DIAGNOSIS — J181 Lobar pneumonia, unspecified organism: Secondary | ICD-10-CM | POA: Diagnosis not present

## 2020-01-31 DIAGNOSIS — E785 Hyperlipidemia, unspecified: Secondary | ICD-10-CM | POA: Diagnosis not present

## 2020-01-31 DIAGNOSIS — I10 Essential (primary) hypertension: Secondary | ICD-10-CM | POA: Diagnosis not present

## 2020-01-31 DIAGNOSIS — R05 Cough: Secondary | ICD-10-CM | POA: Diagnosis not present

## 2020-01-31 DIAGNOSIS — Z8701 Personal history of pneumonia (recurrent): Secondary | ICD-10-CM | POA: Diagnosis not present

## 2020-01-31 DIAGNOSIS — R079 Chest pain, unspecified: Secondary | ICD-10-CM | POA: Diagnosis not present

## 2020-02-01 DIAGNOSIS — R079 Chest pain, unspecified: Secondary | ICD-10-CM | POA: Diagnosis not present

## 2020-02-01 DIAGNOSIS — J189 Pneumonia, unspecified organism: Secondary | ICD-10-CM | POA: Diagnosis not present

## 2020-02-03 DIAGNOSIS — I10 Essential (primary) hypertension: Secondary | ICD-10-CM | POA: Diagnosis not present

## 2020-02-03 DIAGNOSIS — E7800 Pure hypercholesterolemia, unspecified: Secondary | ICD-10-CM | POA: Diagnosis not present

## 2020-02-08 DIAGNOSIS — J189 Pneumonia, unspecified organism: Secondary | ICD-10-CM | POA: Diagnosis not present

## 2020-02-08 DIAGNOSIS — Z09 Encounter for follow-up examination after completed treatment for conditions other than malignant neoplasm: Secondary | ICD-10-CM | POA: Diagnosis not present

## 2020-02-08 DIAGNOSIS — I10 Essential (primary) hypertension: Secondary | ICD-10-CM | POA: Diagnosis not present

## 2020-02-21 ENCOUNTER — Other Ambulatory Visit: Payer: Self-pay

## 2020-02-21 ENCOUNTER — Encounter: Payer: Self-pay | Admitting: Internal Medicine

## 2020-02-21 ENCOUNTER — Ambulatory Visit (INDEPENDENT_AMBULATORY_CARE_PROVIDER_SITE_OTHER): Payer: Medicare Other | Admitting: Internal Medicine

## 2020-02-21 VITALS — BP 140/86 | HR 86 | Temp 98.2°F | Ht 63.0 in | Wt 116.4 lb

## 2020-02-21 DIAGNOSIS — J471 Bronchiectasis with (acute) exacerbation: Secondary | ICD-10-CM | POA: Diagnosis not present

## 2020-02-21 MED ORDER — PREDNISONE 5 MG PO TABS: ORAL_TABLET | ORAL | 0 refills | Status: AC

## 2020-02-21 MED ORDER — CEPHALEXIN 500 MG PO CAPS: 500.0000 mg | ORAL_CAPSULE | Freq: Three times a day (TID) | ORAL | 0 refills | Status: AC

## 2020-02-21 NOTE — Progress Notes (Signed)
Brief patient profile:  37 yowf never smoker with lots of passive esposure with new onset wheezing mid 1990s  eval by Dr Bernita Buffy dx with cat /dog/grass rx with allergy shots x several years better s other treatments needed but still got "bronchitis" several times per year rx pred/abx no need for regular inhalers >  eval by [place] when Dr Lorin Picket Retired rec dulera and did better until around 2010 started coughing after supper when took losartan but better p stopped losartan as of March 2018 and referred to pulmonary clinic 02/27/2017 by Dr   Helene Kelp for abn ct chest done as part of the work up cough     History of Present Illness  02/27/2017 1st [name] [facility] office visit/ [facility]  Re bronchiectasis Chief Complaint  Patient presents with  . Pulmonary Consult    Referred by Dr. Yetta Barre. Pt c/o cough for the past 8 yrs.    cough has present for 8 years, better since March 2018, cough some every 24 hours seems worse after supper  Takes dulera 200 2 puffs in am and then again after taking pm dose of dulera starts coughing  Most productive p supper with clear mucus  "95% better p losartan stopped so that's not why she's here"  Not limited by breathing from desired activities   rec Plan A = Automatic =  dulera 100 Take 2 puffs first thing in am and then another 2 puffs about 12 hours later.  Plan B = Backup Only use your albuterol [name])    Try prilosec otc 20mg   Take 30-60 min before first meal of the day and [name] 20 mg one right after supper  until  Return GERD     04/10/2017  f/u ov/Wert re: obst  Bronchiectasis  maint on dulera 100 2bid / gerd rx  Chief Complaint  Patient presents with  . Follow-up    Cough has improved- coughing less and not producing less sputum, yellow in color.      Not limited by breathing from desired activities    No obvious day to day or daytime variability or assoc excess/ purulent sputum or mucus plugs or hemoptysis or cp or  chest tightness, subjective wheeze or overt sinus or hb symptoms. No unusual exp hx or h/o childhood pna/ asthma or knowledge of premature birth.  Sleeping ok without nocturnal  or early am exacerbation  of respiratory  c/o's or need for noct saba. Also denies any obvious fluctuation of symptoms with weather or environmental changes or other aggravating or alleviating factors except as outlined above    OV 12/28/2017  Chief Complaint  Patient presents with  . Consult    Switching from MW to MR due to obstructive bronchiectasis.  Pt states she was diagnosed at [facility] with obstructive bronchiectasis 10/2016 but is unsure if that is what she has. Pt states at times, she has c/o SOB, cough with yellow to green mucus, and CP.  Pt states she has an occ. wheeze and has to try to cough hard to be able to get mucus up.    79 year old female from [place], [place] here for transfer of care from Dr. Melvyn Novas to Dr. Chase Caller for bronchiectasis  Patient tells me that she is not sure about the  diagnosis of bronchiectasis and wants to make sure she has this diagnosis.  And at that time it was only nocturnal cough.  It was associated with [name] usage.  Around  this time he tells me that she started with chronic cough some 15 years ago.  The [name] was stopped and the cough improved then several years later approximately 10 years ago the cough returned and again it was a nocturnal cough associated with sputum production particularly at night.  The amount of sputum she brings out since then is around one third of a cup and it is discolored with white yellowish color.  Occasionally she will flareup requiring antibiotic/prednisone.  But for most of the time daytime she is fine except in the evenings and at night she starts having significant amount of cough and she has to strain to bring up the sputum.  She had a CT scan of the chest in 2008 a personally visualized that showed bilateral lingula and right  middle lobe bronchiectasis but she is unaware of this diagnosis back then.  She tells me that approximately 1 year ago she got hospitalized at [facility] and treated for bronchiectasis exacerbation [initially there was confusion whether she might have a pulmonary embolism] and around this time her losartan was stopped and with this the cough improved.  She has been on [facility] for several years which only helps her shortness of breath but not the mucus production.  Nevertheless overall she continues to have not evening mucus production with the cough and this happens every day and it is affecting her quality of life by preventing going to the church.  She has had allergy workup as above and below and it is negative so far.  2018 CT scan of the chest that I personally visualized shows continued persistence of the lingula and right middle lobe bronchiectasis and per radiology has progressed in 10 years.  She is not aware of having had etiologic workup.  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     Results for Kristy [name], Kristy [name] (MRN [id number]) as of 12/28/2017 10:23  Ref. Range 02/27/2017 12:40  Sed Rate   Latest Ref Range: 0 - 30 mm/hr 3  [name] IgE Latest Units: kU/L <0.10  Pecan/Hickory Tree IgE Latest Units: kU/L <0.10  IgE (Immunoglobulin E), Serum Latest Ref Range: <115 kU/L 11  Allergen, D pternoyssinus,d7 Latest Units: kU/L <0.10  Cat Dander Latest Units: kU/L 0.17 (H)  Dog Dander Latest Units: kU/L <0.10  [place] Grass Latest Units: kU/L <0.10  Johnson Grass Latest Units: kU/L 0.14 (H)  Timothy Grass Latest Units: kU/L 1.05 (H)  Cockroach Latest Units: kU/L <0.10  Aspergillus fumigatus, m3 Latest Units: kU/L <0.10  Allergen, Comm Silver Wendee Copp, t9 Latest Units: kU/L <0.10  Allergen, Cottonwood, t14 Latest Units: kU/L <0.10  Elm IgE Latest Units: kU/L <0.10  Allergen, Mulberry, t76 Latest Units:  kU/L <0.10  Allergen, Oak,t7 Latest Units: kU/L <0.10  Common Ragweed Latest Units: kU/L 0.43 (H)  Allergen, Mouse Urine Protein, e78 Latest Units: kU/L <0.10  D. farinae Latest Units: kU/L <0.10  Allergen, Cedar tree, t12 Latest Units: kU/L <0.10  Box Elder IgE Latest Units: kU/L <0.10  Rough Pigweed  IgE Latest Units: kU/L <0.10   Results for Kristy [name], Kristy [name] (MRN [id number]) as of 12/28/2017 10:51  Ref. Range 02/27/2017 12:23 02/27/2017 12:40  Nitric Oxide Unknown 26       OV 01/27/2018  Chief Complaint  Patient presents with  . Follow-up    Feeling better coughing up less mucus ,flutter vaule is very useful.    Follow-up bronchiectasis  This visit is to review etiologic workup but she only had immunoglobulin blood test which is normal. For some unclear reason our orders for point from gold, alpha-1 and autoimmune antibodies did not go through. This will have to be repeated. She did have pulmonary function testing and this shows moderate obstruction/restriction. Currently with [facility] and last time I added Mucinex along with flutter valve have improved his symptoms significantly. Her COPD cat score is 7 showing minimal level symptoms. She is willing to try added on Spiriva as a sample to see if she can make further inroads into her symptoms. Review of the chart shows 2018 CT scan of the chest done in the midst of an exacerbation was much worse with bronchiectasis compared to 10 years ago. She's not sure this is accurate. She is open to having a repeat CT scan done. She has many questions were bronchiectasis and management . Results for Kristy [name], Kristy [name] (MRN [id number]) as of 01/27/2018 11:28  Ref. Range 12/28/2017 11:26  Immunoglobulin A Latest Ref Range: 81 - 463 mg/dL 168  IgG (Immunoglobin G), Serum Latest Ref Range: 694 - 1,618 mg/dL 980   Results for Kristy [name], Kristy [name] (MRN [id number]) as of 01/27/2018 11:28  Ref. Range 01/27/2018 09:40  FVC-Post Latest Units: L 1.62  FVC-%Pred-Post  Latest Units: % 59  FVC-%Change-Post Latest Units: % -4  FEV1-Post Latest Units: L 1.30  FEV1-%Pred-Post Latest Units: % 64  FEV1-%Change-Post Latest Units: % 0  Post FEV1/FVC ratio Latest Units: % 81  Results for Kristy [name], Kristy [name] (MRN [id number]) as of 01/27/2018 11:28  Ref. Range 01/27/2018 09:40  TLC Latest Units: L 4.02  TLC % pred Latest Units: % 80  Results for Kristy [name], Kristy [name] (MRN [id number]) as of 01/27/2018 11:28  Ref. Range 01/27/2018 09:40  DLCO unc Latest Units: ml/min/mmHg 18.85  DLCO unc % pred Latest Units: % 78    OV 08/10/2018  Subjective:  Patient ID: Kristy [name], female , DOB: 1941/08/21 , age 2 y.o. , MRN: [id number] , ADDRESS: [address] [place] [id number]   08/10/2018 -   Chief Complaint  Patient presents with  . Follow-up    pt states she is at baseline, does note prod cough with brown/green/clear mucus.  cough is worse after dinner.  Didn't start spiriva d/t side effects.      HPI [place] 79 y.o. -follow bronchiectasis.  Last visit she was doing well with Mucinex but then she stopped the Mucinex because of abdominal cramps.  I recommended Spiriva was added to her Ruthe Mannan but because of fear of hair loss which she looked up on the Internet she did not take it.  She is adamant she will not give Spiriva trial.  Overall she feels stable although the CAT score she is reporting increased symptomatology but she and says she is been stable since the spring 2019.  In the interim she has had 2 CT scans of the chest in May 2019 and August 2019.  Her lung nodule has resolved.  Bronchiectasis is stable.  Radiology is not recommending any further follow-up.  She is up-to-date with her flu shot and pneumonia vaccines.  Her main issue is that she has significant shortness of breath climbing stairs.  She is a 7 years ago she desaturated from 98% to 93%.  This has not been checked in a while.  [facility] never been to pulmonary  rehabilitation and is open to attending that locally.    ROS -  per HPI     OV 02/21/2020  Subjective:  Patient ID: Kristy [name], female , DOB: 03-17-1941 , age 79 y.o. , MRN: [id number] , ADDRESS: [address] [place] [id number]   02/21/2020 -   Chief Complaint  Patient presents with  . Follow-up    cough.  april 25th, pain in chest with fever, 27th went to ER.  Had pna, kept overnight.  prescribed antibiotics.  finished antibiotics, s/ came back.  back to pcp.  still has cp all the time in center of chest with deep breath.  afternoon chills and 101.4 yesterday.   Follow-up bronchiectasis  HPI Kristy [name] 79 y.o. -last seen in 2019.  Last CT scan of the chest and pulmonary function test was in the middle of 2019.  Since then lost to follow-up.  But she says she has been doing well at home with [facility] for her bronchiectasis.  However on January 31, 2019 when she was admitted overnight to [facility] with what sounds like a bronchiectasis flare.  She says she had a CT scan of the chest but I am not able to visualize this.  She was discharged the next morning with azithromycin and Omnicef.  She finished this on Feb 06, 2020.  She saw her primary care physician on Feb 08, 2020 and was asked to take proair but this did not work well for her.  This is because of some right lower lobe crackles.  Then on Feb 10, 2020 she started having symptoms of chest tightness and also hurting when taking a deep breath.  She is unable to lie on her sides.  She is also got some wheezing.  Some increased cough.  Symptoms get worse by the afternoon.  She does not bring much sputum but by end of the day she is bringing some green sputum.  She feels she needs another course of antibiotic and prednisone.  Otherwise feeling okay.      CAT COPD Symptom & Quality of Life Score (GSK trademark) 0 is no burden. 5 is highest burden 01/27/2018  08/10/2018   Never Cough -> Cough all the time 1 3  No phlegm  in chest -> Chest is full of phlegm 2 3  No chest tightness -> Chest feels very tight 0 2  No dyspnea for 1 flight stairs/hill -> Very dyspneic for 1 flight of stairs 2 5  No limitations for ADL at home -> Very limited with ADL at home 0 0  Confident leaving home -> Not at all confident leaving home 0 1  Sleep soundly -> Do not sleep soundly because of lung condition 0 1  Lots of Energy -> No energy at all 2 1  TOTAL Score (max 40)  7 16    ROS - per HPI     has a past medical history of Asthma.   reports that she has never smoked. She has never used smokeless tobacco.  Past Surgical History:  Procedure Laterality Date  . APPENDECTOMY    . hesterectomy    . MASTECTOMY Bilateral     Allergies  Allergen Reactions  . Avelox [Moxifloxacin Hcl In Nacl]   . Ciprofloxacin   . Doxycycline Diarrhea  . Floxin [Ofloxacin]   . Iohexol      Code: HIVES, Desc: PER RHONDA Leith-Hatfield @ DR SOOD OFFICE, 05/04/07 (PT IS ALLERGIC TO CONTRAST) CHANGE TO W/O CM. RM, Onset Date: [id number]   . Levaquin [Levofloxacin In D5w]   . Maxaquin [Lomefloxacin]   .  Noroxin [Norfloxacin]   . Quinolones   . Tequin [Gatifloxacin]   . Trovan [Alatrofloxacin]   . Zagam [Sparfloxacin]     Immunization History  Administered Date(s) Administered  . Influenza, High Dose Seasonal PF 07/22/2017, 07/09/2018, 07/24/2019  . Moderna SARS-COVID-2 Vaccination 11/30/2019, 12/02/2019  . Pneumococcal Conjugate-13 10/08/2014  . Pneumococcal Polysaccharide-23 10/20/2007, 05/19/2012    Family History  Problem Relation Age of Onset  . Allergic rhinitis Neg Hx   . Angioedema Neg Hx   . Asthma Neg Hx   . Atopy Neg Hx   . Eczema Neg Hx   . Immunodeficiency Neg Hx   . Urticaria Neg Hx      Current Outpatient Medications:  .  B Complex Vitamins (B COMPLEX PO), Take by mouth daily., Disp: , Rfl:  .  BIOTIN PO, Take 1 capsule by mouth daily., Disp: , Rfl:  .  Borage, Borago officinalis, (BORAGE OIL) 1000 MG CAPS, Take 1  capsule by mouth daily., Disp: , Rfl:  .  Calcium Carbonate (CALCIUM 600 PO), Take 1 tablet by mouth 2 (two) times daily., Disp: , Rfl:  .  Estradiol (VAGIFEM) 10 MCG TABS vaginal tablet, Place 10 mcg vaginally 2 (two) times a week., Disp: , Rfl:  .  ezetimibe (ZETIA) 10 MG tablet, Take 10 mg by mouth daily., Disp: , Rfl:  .  HAWTHORN EXTRACT PO, Take 600 mg by mouth daily., Disp: , Rfl:  .  MAGNESIUM PO, Take by mouth daily., Disp: , Rfl:  .  mometasone-formoterol (DULERA) 200-5 MCG/ACT AERO, Inhale 2 puffs into the lungs 2 (two) times daily., Disp: , Rfl:  .  OVER THE COUNTER MEDICATION, Take 1 capsule by mouth daily., Disp: , Rfl:  .  Respiratory Therapy Supplies (FLUTTER) DEVI, USE 3-4 TIMES DAILY AS NEEDED., Disp: 1 each, Rfl: 0 .  diphenhydrAMINE (ALLERGY RELIEF) 25 mg capsule, Take 25 mg by mouth at bedtime., Disp: , Rfl:       Objective:   Vitals:   02/21/20 1214  BP: 140/86  Pulse: 86  Temp: 98.2 F (36.8 C)  TempSrc: Temporal  SpO2: 93%  Weight: 116 lb 6.4 oz (52.8 kg)  Height: 5\' 3"  (1.6 m)    Estimated body mass index is 20.62 kg/m as calculated from the following:   Height as of this encounter: 5\' 3"  (1.6 m).   Weight as of this encounter: 116 lb 6.4 oz (52.8 kg).  @WEIGHTCHANGE @  Filed Weights   02/21/20 1214  Weight: 116 lb 6.4 oz (52.8 kg)     Physical Exam Thin lady.  Oral cavity when an mass is normal.  Overall diminished air entry but equal breath sounds.  Expiration slightly prolonged.  No wheezes no crackles.  Abdomen soft normal heart sounds.      Assessment:       ICD-10-CM   1. Bronchiectasis with (acute) exacerbation ([place])  J47.1        Plan:     Patient Instructions      ICD-10-CM   1. Bronchiectasis with (acute) exacerbation (HCC)  J47.1      Take prednisone 40 mg daily x 2 days, then 20mg  daily x 2 days, then 10mg  daily x 2 days, then 5mg  daily x 2 days and stop  Cephalexin 500mg  three times daily x  7 days  Start flutter  valve - do this 10 times daily  Continue dulera for now -but possible we might have to stop the steroid part to the inhaler  Do  full PFT in few weeks  Am getting an opinion on your CT done at [facility] April 2021  from our radiologist  AT some point you might need bronchoscpy with lavage  Followup  - Dr Carlis Abbott - bronchiectasis specialist in next several weeks        SIGNATURE    Dr. Brand Males, M.D., F.C.C.P,  Pulmonary and Critical Care Medicine Staff Physician, [place] Director - Interstitial Lung Disease  Program  Pulmonary [place] at [place], [place], [address]  Pager: [phone], If no answer or between  15:00h - 7:00h: call [phone] Telephone: [phone]  12:34 PM 02/21/2020

## 2020-02-21 NOTE — Addendum Note (Signed)
Addended by: Vanessa Barbara on: 02/21/2020 01:15 PM   Modules accepted: Orders

## 2020-02-21 NOTE — Addendum Note (Signed)
Addended by: Vanessa Barbara on: 02/21/2020 02:02 PM   Modules accepted: Orders

## 2020-02-21 NOTE — Addendum Note (Signed)
Addended by: Amado Coe on: 02/21/2020 01:18 PM   Modules accepted: Orders

## 2020-02-21 NOTE — Patient Instructions (Addendum)
    ICD-10-CM   1. Bronchiectasis with (acute) exacerbation (HCC)  J47.1      Take prednisone 40 mg daily x 2 days, then 20mg  daily x 2 days, then 10mg  daily x 2 days, then 5mg  daily x 2 days and stop  Cephalexin 500mg  three times daily x  7 days  Start flutter valve - do this 10 times daily  Continue dulera for now -but possible we might have to stop the steroid part to the inhaler  Do full PFT in few weeks  Am getting an opinion on your CT done at [facility] April 2021  from our radiologist  AT some point you might need bronchoscpy with lavage  Followup  - Dr Carlis Abbott - bronchiectasis specialist in next several weeks

## 2020-04-02 ENCOUNTER — Ambulatory Visit: Payer: Medicare Other | Admitting: Internal Medicine

## 2020-04-03 ENCOUNTER — Encounter: Payer: Self-pay | Admitting: Critical Care Medicine

## 2020-04-03 ENCOUNTER — Ambulatory Visit (INDEPENDENT_AMBULATORY_CARE_PROVIDER_SITE_OTHER): Payer: Medicare Other | Admitting: Critical Care Medicine

## 2020-04-03 ENCOUNTER — Other Ambulatory Visit: Payer: Self-pay

## 2020-04-03 VITALS — BP 132/82 | HR 78 | Temp 98.4°F | Ht 63.0 in | Wt 117.4 lb

## 2020-04-03 DIAGNOSIS — J479 Bronchiectasis, uncomplicated: Secondary | ICD-10-CM

## 2020-04-03 MED ORDER — SODIUM CHLORIDE 3 % IN NEBU: INHALATION_SOLUTION | Freq: Every day | RESPIRATORY_TRACT | 12 refills | Status: DC

## 2020-04-03 MED ORDER — SODIUM CHLORIDE 3 % IN NEBU
INHALATION_SOLUTION | Freq: Every day | RESPIRATORY_TRACT | 12 refills | Status: DC
Start: 2020-04-03 — End: 2021-05-16

## 2020-04-03 MED ORDER — ANORO ELLIPTA 62.5-25 MCG/INH IN AEPB
1.0000 | INHALATION_SPRAY | Freq: Every day | RESPIRATORY_TRACT | 0 refills | Status: DC
Start: 2020-04-03 — End: 2020-04-10

## 2020-04-03 NOTE — Patient Instructions (Addendum)
Thank you for visiting Dr. Carlis Abbott at [facility]. We recommend the following: Orders Placed This Encounter  Procedures  .  MYCOBACTERIA, CULTURE, WITH FLUOROCHROME SMEAR   Orders Placed This Encounter  Procedures  .  MYCOBACTERIA, CULTURE, WITH FLUOROCHROME SMEAR    Standing Status:   Future    Standing Expiration Date:   04/03/2021   Start Anoro once daily and stop Dulera. If you notice hair loss or you have worse breathing or wheezing, then stop Anoro and restart Dulera.  Meds ordered this encounter  Medications  . sodium chloride HYPERTONIC 3 % nebulizer solution    Sig: Take by nebulization daily.    Dispense:  750 mL    Refill:  12    Return in about 4 months (around 08/03/2020).    Please do your part to reduce the spread of COVID-19.

## 2020-04-03 NOTE — Progress Notes (Signed)
Synopsis: Referred in 2018 for bronchitis by Ronita Hipps, MD.  Previously a patient of Dr. Melvyn Novas and Dr. Chase Caller.  Subjective:   PATIENT ID: Kristy [name] GENDER: female DOB: 12/20/1940, MRN: [id number]  Chief Complaint  Patient presents with  . Consult    Patient is here for Bronchiectasis. Patient has a cough that is worse at night, with clear to yellow sputum.     Kristy [name] is a 79 year old woman who presents for evaluation of bronchiectasis.  She was diagnosed with bronchiectasis on CT scan originally in 2008, but was not aware of this diagnosis until 2018.  It was felt to be postinfectious.  She has a history of allergies treated with allergy shots by Dr. Velora Heckler for many years.  She was diagnosed with asthma in the 1990s and has been on [facility] since.  Prior to this she was treated with as needed bronchodilators, but required prednisone and antibiotics several times per year.  She was on Symbicort at 1 point for several months with severe hair loss, which has not occurred with [facility].  She had a flare in May 2021 treated with azithromycin and Keflex, no steroids with resolution of her symptoms.  She has started using a flutter valve in the afternoon to help clear mucus.  She does not have mucus production in the morning, and it formerly was only after dinner in the evening, but has progressively become earlier in the day.  When she has tried stopping Dulera she notices it is harder to clear her secretions.  She does not use her rescue inhaler and has never used nebulizers.  She denies fever, chills, significant weight loss, change in appetite.  She is very active and exercises at the [facility].     Flowsheet data:  Lab Results  Component Value Date   NITRICOXIDE 26 02/27/2017    Past Medical History:  Diagnosis Date  . Asthma      Family History  Problem Relation Age of Onset  . Allergic rhinitis Neg Hx   . Angioedema Neg Hx   . Asthma Neg Hx   . Atopy Neg Hx   . Eczema  Neg Hx   . Immunodeficiency Neg Hx   . Urticaria Neg Hx      Past Surgical History:  Procedure Laterality Date  . APPENDECTOMY    . hesterectomy    . MASTECTOMY Bilateral     Social History   Socioeconomic History  . Marital status: Married    Spouse name: Not on file  . Number of children: Not on file  . Years of education: Not on file  . Highest education level: Not on file  Occupational History  . Not on file  Tobacco Use  . Smoking status: Never Smoker  . Smokeless tobacco: Never Used  Vaping Use  . Vaping Use: Never used  Substance and Sexual Activity  . Alcohol use: No    Alcohol/week: 0.0 standard drinks  . Drug use: No  . Sexual activity: Not on file  Other Topics Concern  . Not on file  Social History Narrative  . Not on file   Social Determinants of Health   Financial Resource Strain:   . Difficulty of Paying Living Expenses:   Food Insecurity:   . Worried About Charity fundraiser in the Last Year:   . Arboriculturist in the Last Year:   Transportation Needs:   . Film/video editor (Medical):   Marland Kitchen Lack of Transportation (  Non-Medical):   Physical Activity:   . Days of Exercise per Week:   . Minutes of Exercise per Session:   Stress:   . Feeling of Stress :   Social Connections:   . Frequency of Communication with Friends and Family:   . Frequency of Social Gatherings with Friends and Family:   . Attends Religious Services:   . Active Member of Clubs or Organizations:   . Attends Archivist Meetings:   Marland Kitchen Marital Status:   Intimate Partner Violence:   . Fear of Current or Ex-Partner:   . Emotionally Abused:   Marland Kitchen Physically Abused:   . Sexually Abused:      Allergies  Allergen Reactions  . Avelox [Moxifloxacin Hcl In Nacl]   . Ciprofloxacin   . Doxycycline Diarrhea  . Floxin [Ofloxacin]   . Iohexol      Code: HIVES, Desc: PER RHONDA [place] @ DR SOOD OFFICE, 05/04/07 (PT IS ALLERGIC TO CONTRAST) CHANGE TO W/O CM. RM, Onset Date:  [id number]   . Levaquin [Levofloxacin In D5w]   . Maxaquin [Lomefloxacin]   . Noroxin [Norfloxacin]   . Quinolones   . Tequin [Gatifloxacin]   . Trovan [Alatrofloxacin]   . Zagam [Sparfloxacin]      Immunization History  Administered Date(s) Administered  . Influenza, High Dose Seasonal PF 07/22/2017, 07/09/2018, 07/24/2019  . Moderna SARS-COVID-2 Vaccination 11/30/2019, 12/02/2019  . Pneumococcal Conjugate-13 10/08/2014  . Pneumococcal Polysaccharide-23 10/20/2007, 05/19/2012    Outpatient Medications Prior to Visit  Medication Sig Dispense Refill  . B Complex Vitamins (B COMPLEX PO) Take by mouth daily.    Marland Kitchen BIOTIN PO Take 1 capsule by mouth daily.    Chong Sicilian, Borago officinalis, (BORAGE OIL) 1000 MG CAPS Take 1 capsule by mouth daily.    . Calcium Carbonate (CALCIUM 600 PO) Take 1 tablet by mouth 2 (two) times daily.    . diphenhydrAMINE (ALLERGY RELIEF) 25 mg capsule Take 25 mg by mouth at bedtime.    . Estradiol (VAGIFEM) 10 MCG TABS vaginal tablet Place 10 mcg vaginally 2 (two) times a week.    . ezetimibe (ZETIA) 10 MG tablet Take 10 mg by mouth daily.    Marland Kitchen HAWTHORN EXTRACT PO Take 600 mg by mouth daily.    Marland Kitchen MAGNESIUM PO Take by mouth daily.    . mometasone-formoterol (DULERA) 200-5 MCG/ACT AERO Inhale 2 puffs into the lungs 2 (two) times daily.    Marland Kitchen OVER THE COUNTER MEDICATION Take 1 capsule by mouth daily.    Marland Kitchen Respiratory Therapy Supplies (FLUTTER) DEVI USE 3-4 TIMES DAILY AS NEEDED. 1 each 0   No facility-administered medications prior to visit.    Review of Systems  Constitutional: Negative for chills, fever, malaise/fatigue and weight loss.  HENT: Negative for congestion.   Cardiovascular: Negative for chest pain and leg swelling.  Gastrointestinal: Negative for nausea and vomiting.  Endo/Heme/Allergies: Positive for environmental allergies.     Objective:   Vitals:   04/03/20 1136  BP: 132/82  Pulse: 78  Temp: 98.4 F (36.9 C)  TempSrc: Oral  SpO2:  96%  Weight: 117 lb 6.4 oz (53.3 kg)  Height: 5\' 3"  (1.6 m)   96% on  RA BMI Readings from Last 3 Encounters:  04/03/20 20.80 kg/m  02/21/20 20.62 kg/m  08/10/18 20.93 kg/m   Wt Readings from Last 3 Encounters:  04/03/20 117 lb 6.4 oz (53.3 kg)  02/21/20 116 lb 6.4 oz (52.8 kg)  08/10/18 121 lb (54.9 kg)  Physical Exam Constitutional:      Appearance: Normal appearance. She is not ill-appearing.  HENT:     Head: Normocephalic and atraumatic.  Eyes:     General: No scleral icterus. Cardiovascular:     Rate and Rhythm: Normal rate and regular rhythm.     Heart sounds: No murmur heard.   Pulmonary:     Comments: Scattered inspiratory squeaks, expiratory faint rhonchi.  Breathing comfortably on room air, no conversational dyspnea. Abdominal:     General: There is no distension.     Palpations: Abdomen is soft.  Musculoskeletal:        General: No swelling or deformity.     Cervical back: Neck supple.  Lymphadenopathy:     Cervical: No cervical adenopathy.  Skin:    General: Skin is warm and dry.     Findings: No rash.  Neurological:     General: No focal deficit present.     Mental Status: She is alert.     Coordination: Coordination normal.  Psychiatric:        Mood and Affect: Mood normal.        Behavior: Behavior normal.      CBC    Component Value Date/Time   WBC 8.1 02/27/2017 1240   RBC 5.57 (H) 02/27/2017 1240   HGB 15.9 (H) 02/27/2017 1240   HCT 47.8 (H) 02/27/2017 1240   PLT 318.0 02/27/2017 1240   MCV 85.8 02/27/2017 1240   MCHC 33.2 02/27/2017 1240   RDW 13.9 02/27/2017 1240   LYMPHSABS 2.0 02/27/2017 1240   MONOABS 0.7 02/27/2017 1240   EOSABS 0.1 02/27/2017 1240   BASOSABS 0.1 02/27/2017 1240    CHEMISTRY No results for input(s): NA, K, CL, CO2, GLUCOSE, BUN, CREATININE, CALCIUM, MG, PHOS in the last 168 hours. CrCl cannot be calculated (No successful lab value found.).  2019 labs: IgM 181 IgG 980 IgA 168 Alpha 1 antitrypsin  158, PI*MM  Anti-SSA, SSB negative Anti-SCL 70 negative Antinuclear antibody negative Anti ds DNA antibody negative  Allergy testing 02/2017: + Cats, grasses, ragweed  Chest Imaging- films reviewed: CT chest 05/11/2018-bronchiectasis most notable in right upper, right middle, lingula, and minimally left upper lobe.  Scattered tree-in-bud opacities.  Likely mucus infections in lingula and RML.  Scattered tree-in-bud opacities in dependent lower lobes.  Pulmonary Functions Testing Results: PFT Results Latest Ref Rng & Units 01/27/2018  FVC-Pre L 1.68  FVC-Predicted Pre % 62  FVC-Post L 1.62  FVC-Predicted Post % 59  Pre FEV1/FVC % % 78  Post FEV1/FCV % % 81  FEV1-Pre L 1.31  FEV1-Predicted Pre % 64  FEV1-Post L 1.30  DLCO UNC% % 78  DLCO COR %Predicted % 126  TLC L 4.02  TLC % Predicted % 80  RV % Predicted % 100   No significant obstruction or bronchodilator reversibility.  No significant restriction, air trapping, or hyperinflation.  Diffusion mildly impaired.  Flow volume loop supports mixed restriction obstruction with improvement of obstruction postbronchodilator.       Assessment & Plan:     ICD-10-CM   1. 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  Start using hypertonic saline nebulizers with this. -We'll determine if she can transition to LABA-LAMA off of LABA-ICS inhaler.  She has had issues with hair loss with Spiriva in the past.  Sample of Anoro  given to try for the next week.  If her symptoms improve or remain stable and she has no issues with hair loss, we'll plan to stop Dulera and start Anoro.  She will call us to let us know if she would like a prescription sent in at that time. -She should let us know if she develops acute exacerbation -Up-to-date on pneumococcal, seasonal  flu, Covid vaccines.  35 minutes spent on this encounter, including chart review, time spent face-to-face with the patient, and documentation.   Current Outpatient Medications:  .  B Complex Vitamins (B COMPLEX PO), Take by mouth daily., Disp: , Rfl:  .  BIOTIN PO, Take 1 capsule by mouth daily., Disp: , Rfl:  .  Borage, Borago officinalis, (BORAGE OIL) 1000 MG CAPS, Take 1 capsule by mouth daily., Disp: , Rfl:  .  Calcium Carbonate (CALCIUM 600 PO), Take 1 tablet by mouth 2 (two) times daily., Disp: , Rfl:  .  diphenhydrAMINE (ALLERGY RELIEF) 25 mg capsule, Take 25 mg by mouth at bedtime., Disp: , Rfl:  .  Estradiol (VAGIFEM) 10 MCG TABS vaginal tablet, Place 10 mcg vaginally 2 (two) times a week., Disp: , Rfl:  .  ezetimibe (ZETIA) 10 MG tablet, Take 10 mg by mouth daily., Disp: , Rfl:  .  HAWTHORN EXTRACT PO, Take 600 mg by mouth daily., Disp: , Rfl:  .  MAGNESIUM PO, Take by mouth daily., Disp: , Rfl:  .  mometasone-formoterol (DULERA) 200-5 MCG/ACT AERO, Inhale 2 puffs into the lungs 2 (two) times daily., Disp: , Rfl:  .  OVER THE COUNTER MEDICATION, Take 1 capsule by mouth daily., Disp: , Rfl:  .  Respiratory Therapy Supplies (FLUTTER) DEVI, USE 3-4 TIMES DAILY AS NEEDED., Disp: 1 each, Rfl: 0    Julian Hy, DO [facility] 04/03/2020 12:04 PM

## 2020-04-04 ENCOUNTER — Other Ambulatory Visit: Payer: Medicare Other

## 2020-04-04 DIAGNOSIS — J479 Bronchiectasis, uncomplicated: Secondary | ICD-10-CM

## 2020-04-10 ENCOUNTER — Telehealth: Payer: Self-pay | Admitting: Critical Care Medicine

## 2020-04-10 MED ORDER — ANORO ELLIPTA 62.5-25 MCG/INH IN AEPB: 1.0000 | INHALATION_SPRAY | Freq: Every day | RESPIRATORY_TRACT | 5 refills | Status: DC

## 2020-04-10 NOTE — Telephone Encounter (Signed)
Spoke with pt and she states that she feels like Kristy [name] is working good and would like to stay on it. Pt is aware to stay off of Dulera. New rx for Anoro sent to pharmacy. Nothing further needed at this time.

## 2020-05-05 DIAGNOSIS — E782 Mixed hyperlipidemia: Secondary | ICD-10-CM | POA: Diagnosis not present

## 2020-05-05 DIAGNOSIS — M810 Age-related osteoporosis without current pathological fracture: Secondary | ICD-10-CM | POA: Diagnosis not present

## 2020-05-05 DIAGNOSIS — I10 Essential (primary) hypertension: Secondary | ICD-10-CM | POA: Diagnosis not present

## 2020-05-19 LAB — MYCOBACTERIA,CULT W/FLUOROCHROME SMEAR
MICRO NUMBER:: 10652456
SMEAR:: NONE SEEN
SPECIMEN QUALITY:: ADEQUATE

## 2020-05-20 NOTE — Progress Notes (Signed)
Please let her know that this culture is negative. Thanks!  Julian Hy, DO 05/20/20 10:18 AM [facility]

## 2020-05-21 NOTE — Progress Notes (Signed)
Called and left detailed message on voicemail per DPR about sputum result per Dr Carlis Abbott. Advised to please feel free to call back if any questions. Nothing further needed at this time.

## 2020-06-05 DIAGNOSIS — M25552 Pain in left hip: Secondary | ICD-10-CM | POA: Diagnosis not present

## 2020-06-05 DIAGNOSIS — Z6820 Body mass index (BMI) 20.0-20.9, adult: Secondary | ICD-10-CM | POA: Diagnosis not present

## 2020-06-09 IMAGING — CT CT CHEST W/O CM
2 of 3 series · 15 of 36 positions shown, 18 images · non-contrast
Comparison: CT chest dated 02/04/2018 and 10/31/2016

CLINICAL DATA: Three month follow-up pulmonary nodule

EXAM:
CT CHEST WITHOUT CONTRAST
TECHNIQUE: Multidetector CT imaging of the chest was performed following the
standard protocol without IV contrast.

[Series 2: thorax · axial · 0.58mm/px · z∈[-312,-46]mm · 12 of 157 slices shown, 15 images]
[im 12/157  mediastinal]
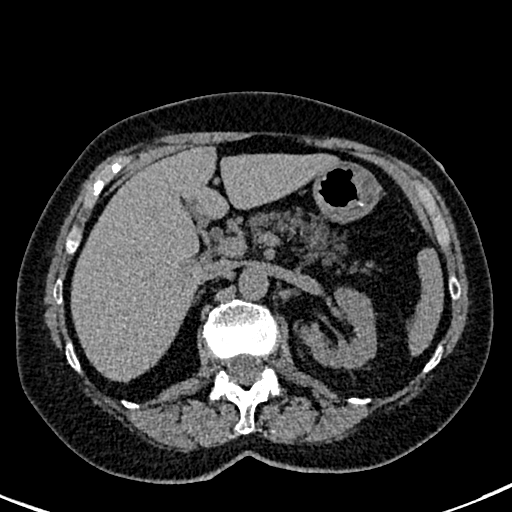
[im 12/157  lung]
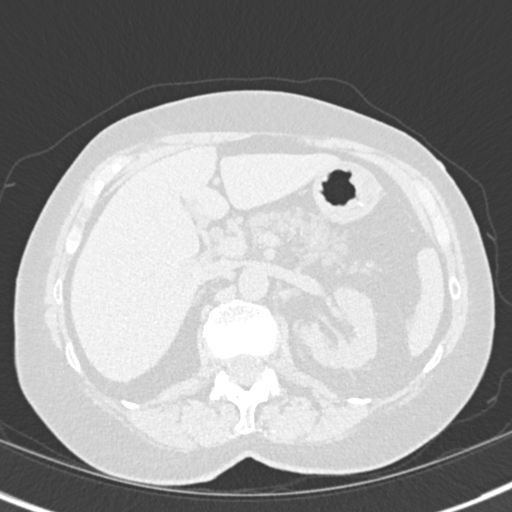
[im 24/157  lung]
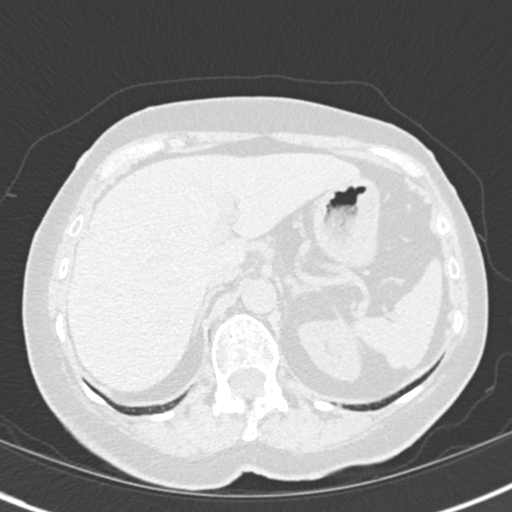
[im 35/157  lung]
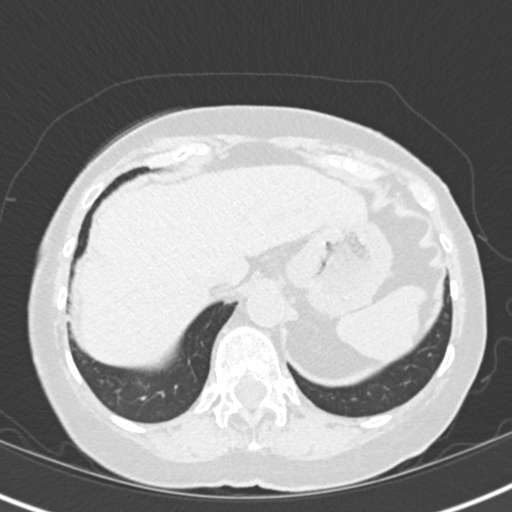
[im 47/157  lung]
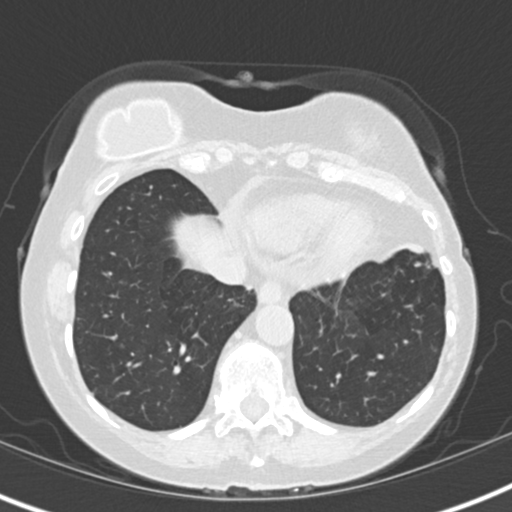
[im 58/157  mediastinal]
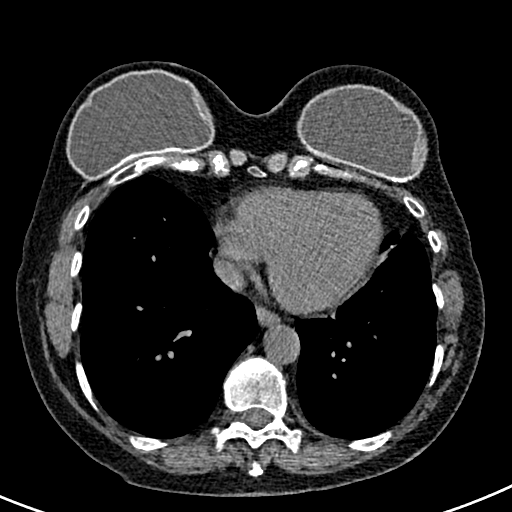
[im 58/157  lung]
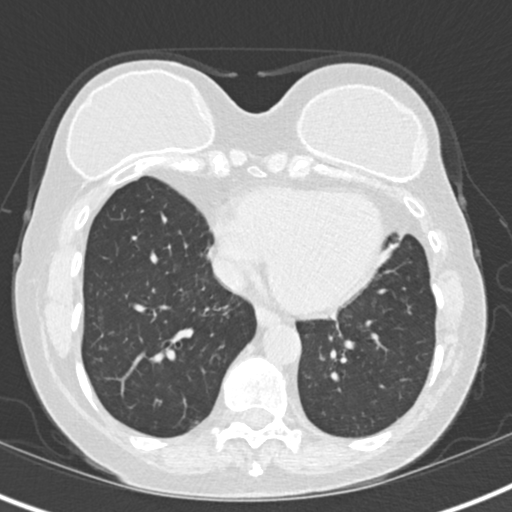
[im 70/157  lung]
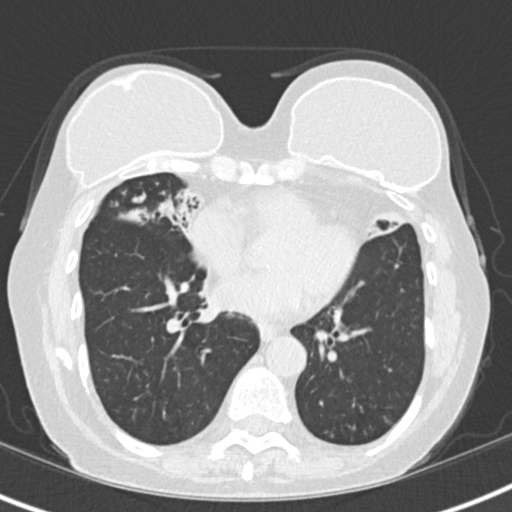
[im 87/157  lung]
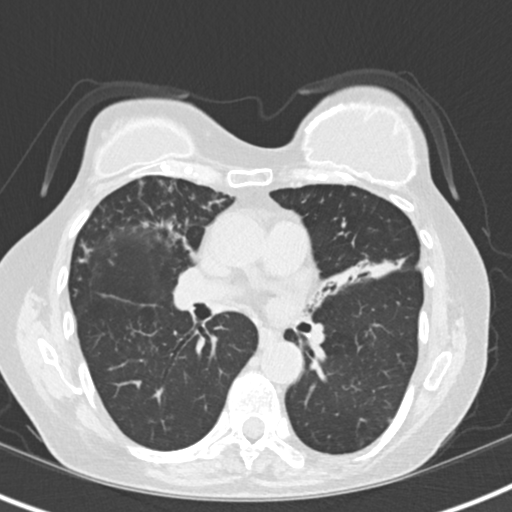
[im 99/157  lung]
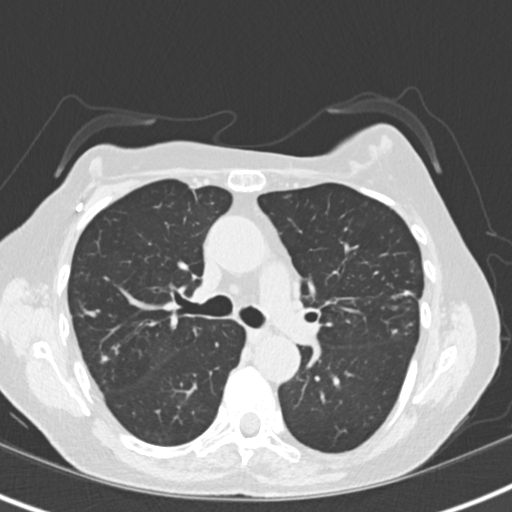
[im 110/157  mediastinal]
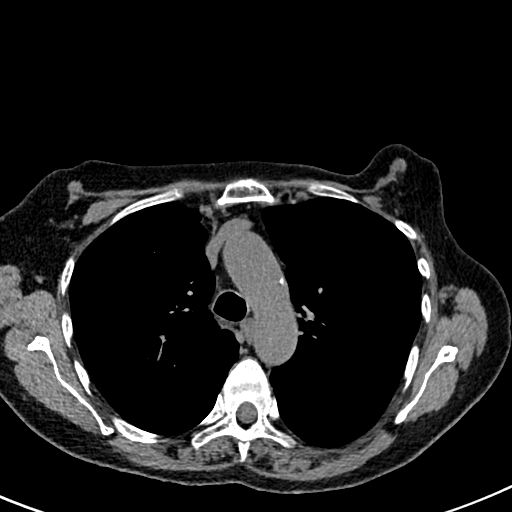
[im 110/157  lung]
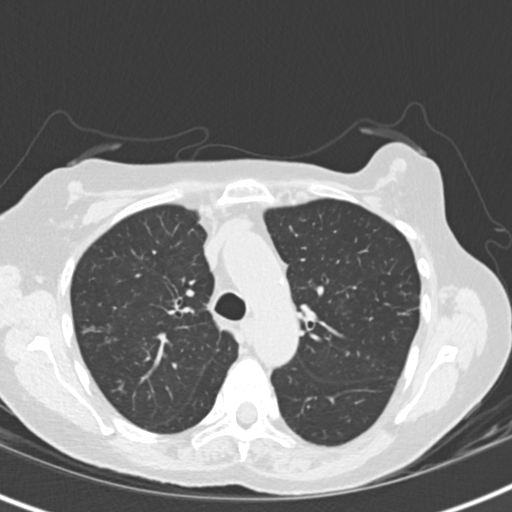
[im 122/157  lung]
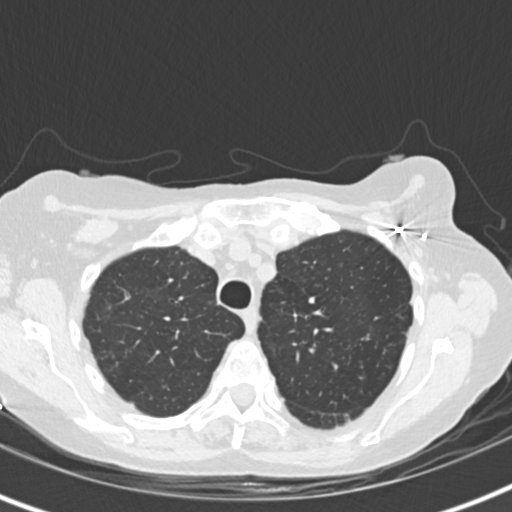
[im 133/157  lung]
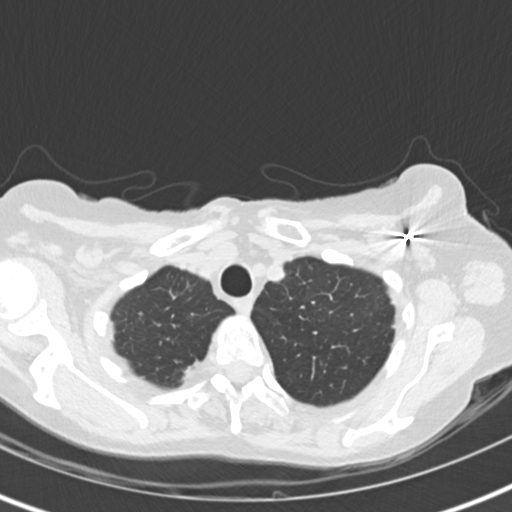
[im 145/157  lung]
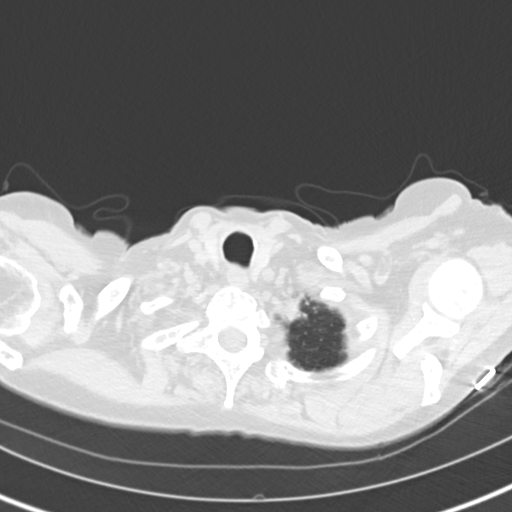

[Series 5: coronal · coronal · 0.57mm/px · 3 of 105 slices shown]
[im 21/105  lung]
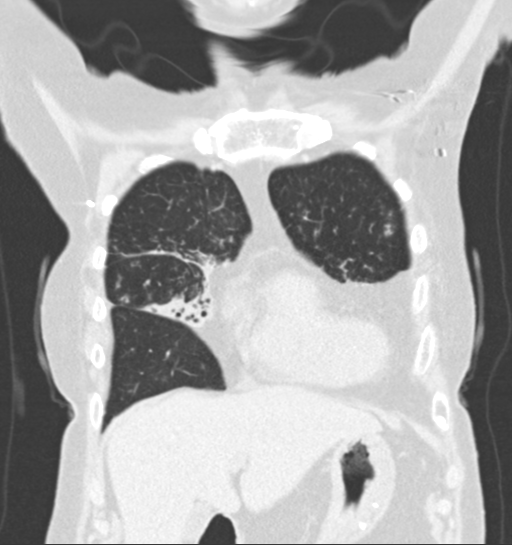
[im 42/105  lung]
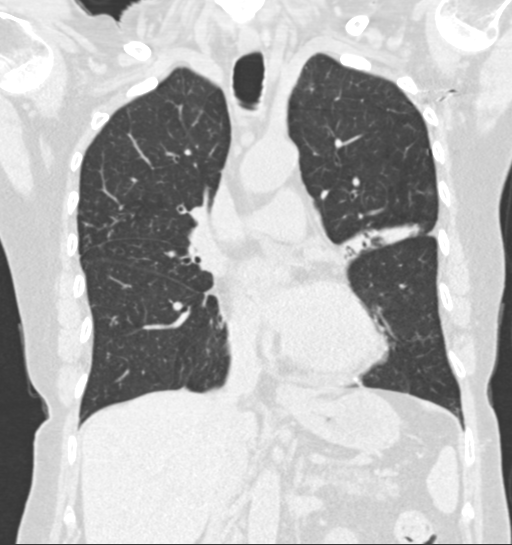
[im 63/105  lung]
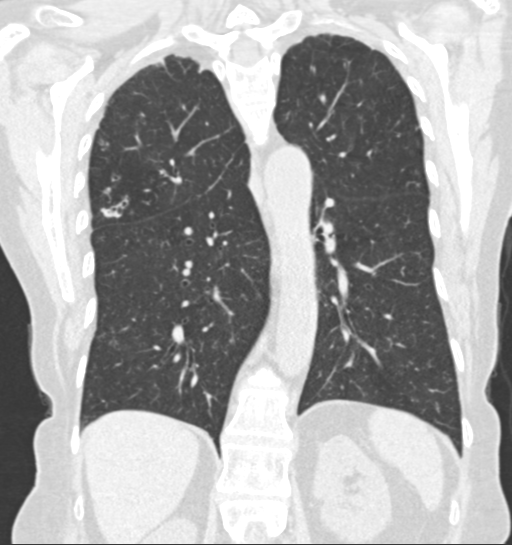

[15 of 36 positions shown; findings below may reference images not displayed]

FINDINGS: Cardiovascular: The heart is normal in size. No pericardial
effusion.

No evidence of thoracic aortic aneurysm. Mild atherosclerotic
calcifications of the aortic arch.

Mild coronary atherosclerosis the LAD.

Mediastinum/Nodes: Small mediastinal lymph nodes which do not meet
pathologic CT size criteria. Left axillary lymph node dissection.

Visualized thyroid is unremarkable.

Lungs/Pleura: Biapical pleural-parenchymal scarring.

Bronchiectasis with tree-in-bud nodular opacities, predominantly in
the bilateral upper lobes, lingula, and right middle lobe,
compatible with chronic atypical mycobacterial infection such as
POCSARI, waxing/waning but overall mildly improved.

Additional tree-in-bud nodularity in the bilateral lower lobes
posteriorly. However, the index 10 mm nodule in the posterior right
lower lobe noted on the prior study is improved (series 3/image 39),
now reflecting faint clustered micro nodules.

No discrete/dominant nodule on the current CT.

No pleural effusion or pneumothorax.

Upper Abdomen: Visualized upper abdomen is grossly unremarkable.

Musculoskeletal: Bilateral breast augmentation.

Visualized osseous structures are within normal limits.
IMPRESSION: Prior dominant solid nodule in the posterior right lower lobe has
improved.

Sequela of chronic atypical mycobacterial infection, as above.

No discrete/dominant nodule on the current CT. As such, dedicated
follow-up is not required per [HOSPITAL] guidelines.

Aortic Atherosclerosis ([id number]).

## 2020-06-28 DIAGNOSIS — M1612 Unilateral primary osteoarthritis, left hip: Secondary | ICD-10-CM | POA: Diagnosis not present

## 2020-06-28 DIAGNOSIS — M25552 Pain in left hip: Secondary | ICD-10-CM | POA: Diagnosis not present

## 2020-07-04 DIAGNOSIS — Z79899 Other long term (current) drug therapy: Secondary | ICD-10-CM | POA: Diagnosis not present

## 2020-07-04 DIAGNOSIS — E782 Mixed hyperlipidemia: Secondary | ICD-10-CM | POA: Diagnosis not present

## 2020-07-06 DIAGNOSIS — M25551 Pain in right hip: Secondary | ICD-10-CM | POA: Diagnosis not present

## 2020-07-06 DIAGNOSIS — M6281 Muscle weakness (generalized): Secondary | ICD-10-CM | POA: Diagnosis not present

## 2020-07-06 DIAGNOSIS — M25552 Pain in left hip: Secondary | ICD-10-CM | POA: Diagnosis not present

## 2020-07-11 DIAGNOSIS — M25551 Pain in right hip: Secondary | ICD-10-CM | POA: Diagnosis not present

## 2020-07-11 DIAGNOSIS — M25552 Pain in left hip: Secondary | ICD-10-CM | POA: Diagnosis not present

## 2020-07-11 DIAGNOSIS — M6281 Muscle weakness (generalized): Secondary | ICD-10-CM | POA: Diagnosis not present

## 2020-07-13 DIAGNOSIS — M6281 Muscle weakness (generalized): Secondary | ICD-10-CM | POA: Diagnosis not present

## 2020-07-13 DIAGNOSIS — M25552 Pain in left hip: Secondary | ICD-10-CM | POA: Diagnosis not present

## 2020-07-13 DIAGNOSIS — M25551 Pain in right hip: Secondary | ICD-10-CM | POA: Diagnosis not present

## 2020-07-16 DIAGNOSIS — N952 Postmenopausal atrophic vaginitis: Secondary | ICD-10-CM | POA: Diagnosis not present

## 2020-07-16 DIAGNOSIS — Z23 Encounter for immunization: Secondary | ICD-10-CM | POA: Diagnosis not present

## 2020-07-16 DIAGNOSIS — Z0000 Encounter for general adult medical examination without abnormal findings: Secondary | ICD-10-CM | POA: Diagnosis not present

## 2020-07-16 DIAGNOSIS — M6281 Muscle weakness (generalized): Secondary | ICD-10-CM | POA: Diagnosis not present

## 2020-07-16 DIAGNOSIS — M25552 Pain in left hip: Secondary | ICD-10-CM | POA: Diagnosis not present

## 2020-07-16 DIAGNOSIS — M25551 Pain in right hip: Secondary | ICD-10-CM | POA: Diagnosis not present

## 2020-07-16 DIAGNOSIS — Z9181 History of falling: Secondary | ICD-10-CM | POA: Diagnosis not present

## 2020-07-16 DIAGNOSIS — E782 Mixed hyperlipidemia: Secondary | ICD-10-CM | POA: Diagnosis not present

## 2020-07-18 DIAGNOSIS — M25551 Pain in right hip: Secondary | ICD-10-CM | POA: Diagnosis not present

## 2020-07-18 DIAGNOSIS — M25552 Pain in left hip: Secondary | ICD-10-CM | POA: Diagnosis not present

## 2020-07-18 DIAGNOSIS — M6281 Muscle weakness (generalized): Secondary | ICD-10-CM | POA: Diagnosis not present

## 2020-07-24 DIAGNOSIS — M6281 Muscle weakness (generalized): Secondary | ICD-10-CM | POA: Diagnosis not present

## 2020-07-24 DIAGNOSIS — M25551 Pain in right hip: Secondary | ICD-10-CM | POA: Diagnosis not present

## 2020-07-24 DIAGNOSIS — M25552 Pain in left hip: Secondary | ICD-10-CM | POA: Diagnosis not present

## 2020-07-26 DIAGNOSIS — M25552 Pain in left hip: Secondary | ICD-10-CM | POA: Diagnosis not present

## 2020-07-26 DIAGNOSIS — M6281 Muscle weakness (generalized): Secondary | ICD-10-CM | POA: Diagnosis not present

## 2020-07-26 DIAGNOSIS — M25551 Pain in right hip: Secondary | ICD-10-CM | POA: Diagnosis not present

## 2020-07-30 DIAGNOSIS — M6281 Muscle weakness (generalized): Secondary | ICD-10-CM | POA: Diagnosis not present

## 2020-07-30 DIAGNOSIS — M25552 Pain in left hip: Secondary | ICD-10-CM | POA: Diagnosis not present

## 2020-07-30 DIAGNOSIS — M25551 Pain in right hip: Secondary | ICD-10-CM | POA: Diagnosis not present

## 2020-08-01 DIAGNOSIS — M25552 Pain in left hip: Secondary | ICD-10-CM | POA: Diagnosis not present

## 2020-08-01 DIAGNOSIS — M6281 Muscle weakness (generalized): Secondary | ICD-10-CM | POA: Diagnosis not present

## 2020-08-01 DIAGNOSIS — M25551 Pain in right hip: Secondary | ICD-10-CM | POA: Diagnosis not present

## 2020-08-07 DIAGNOSIS — M25551 Pain in right hip: Secondary | ICD-10-CM | POA: Diagnosis not present

## 2020-08-07 DIAGNOSIS — M25552 Pain in left hip: Secondary | ICD-10-CM | POA: Diagnosis not present

## 2020-08-07 DIAGNOSIS — M6281 Muscle weakness (generalized): Secondary | ICD-10-CM | POA: Diagnosis not present

## 2020-08-10 ENCOUNTER — Ambulatory Visit (INDEPENDENT_AMBULATORY_CARE_PROVIDER_SITE_OTHER): Payer: Medicare Other | Admitting: Critical Care Medicine

## 2020-08-10 ENCOUNTER — Other Ambulatory Visit: Payer: Self-pay

## 2020-08-10 ENCOUNTER — Encounter: Payer: Self-pay | Admitting: Critical Care Medicine

## 2020-08-10 VITALS — BP 136/80 | HR 75 | Temp 97.5°F | Ht 64.0 in | Wt 119.2 lb

## 2020-08-10 DIAGNOSIS — J479 Bronchiectasis, uncomplicated: Secondary | ICD-10-CM

## 2020-08-10 MED ORDER — ANORO ELLIPTA 62.5-25 MCG/INH IN AEPB: 1.0000 | INHALATION_SPRAY | Freq: Every day | RESPIRATORY_TRACT | 11 refills | Status: DC

## 2020-08-10 NOTE — Progress Notes (Signed)
Synopsis: Referred in 2018 for bronchitis by Ronita Hipps, MD.  Previously a patient of Dr. Melvyn Novas and Dr. Chase Caller.  Subjective:   PATIENT ID: Kristy [name] GENDER: female DOB: 03/13/1941, MRN: [id number]  Chief Complaint  Patient presents with  . Follow-up    none    Kristy [name] is a 79 year old woman with a history of multi lobar bronchiectasis who presents for follow-up.  She is up-to-date on her seasonal flu vaccine and is scheduled for her Moderna Covid booster in a few weeks.  Recently she has been doing well.  She has been doing physical therapy for hip arthritis and has noticed a significant benefit in her symptoms.  She continues to be very active.  Her breathing is great and has not been bothering her.  She uses Anoro once daily and hypertonic saline with her flutter valve in the afternoons.  She expectorates yellow sputum that thins out for the rest of the day.  No wheezing, shortness of breath, fatigue, fever, chills, change in appetite.  She walks about 2 miles a day for exercise.     OV 04/03/20: Kristy [name] is a 79 year old woman who presents for evaluation of bronchiectasis.  She was diagnosed with bronchiectasis on CT scan originally in 2008, but was not aware of this diagnosis until 2018.  It was felt to be postinfectious.  She has a history of allergies treated with allergy shots by Dr. Velora Heckler for many years.  She was diagnosed with asthma in the 1990s and has been on [facility] since.  Prior to this she was treated with as needed bronchodilators, but required prednisone and antibiotics several times per year.  She was on Symbicort at 1 point for several months with severe hair loss, which has not occurred with [facility].  She had a flare in May 2021 treated with azithromycin and Keflex, no steroids with resolution of her symptoms.  She has started using a flutter valve in the afternoon to help clear mucus.  She does not have mucus production in the morning, and it formerly  was only after dinner in the evening, but has progressively become earlier in the day.  When she has tried stopping Dulera she notices it is harder to clear her secretions.  She does not use her rescue inhaler and has never used nebulizers.  She denies fever, chills, significant weight loss, change in appetite.  She is very active and exercises at the [facility].   Flowsheet data:  Lab Results  Component Value Date   NITRICOXIDE 26 02/27/2017    Past Medical History:  Diagnosis Date  . Asthma      Family History  Problem Relation Age of Onset  . Allergic rhinitis Neg Hx   . Angioedema Neg Hx   . Asthma Neg Hx   . Atopy Neg Hx   . Eczema Neg Hx   . Immunodeficiency Neg Hx   . Urticaria Neg Hx      Past Surgical History:  Procedure Laterality Date  . APPENDECTOMY    . hesterectomy    . MASTECTOMY Bilateral     Social History   Socioeconomic History  . Marital status: Married    Spouse name: Not on file  . Number of children: Not on file  . Years of education: Not on file  . Highest education level: Not on file  Occupational History  . Not on file  Tobacco Use  . Smoking status: Never Smoker  . Smokeless tobacco: Never Used  Vaping Use  . Vaping Use: Never used  Substance and Sexual Activity  . Alcohol use: No    Alcohol/week: 0.0 standard drinks  . Drug use: No  . Sexual activity: Not on file  Other Topics Concern  . Not on file  Social History Narrative  . Not on file   Social Determinants of Health   Financial Resource Strain:   . Difficulty of Paying Living Expenses: Not on file  Food Insecurity:   . Worried About Charity fundraiser in the Last Year: Not on file  . Ran Out of Food in the Last Year: Not on file  Transportation Needs:   . Lack of Transportation (Medical): Not on file  . Lack of Transportation (Non-Medical): Not on file  Physical Activity:   . Days of Exercise per Week: Not on file  . Minutes of Exercise per Session: Not on file    Stress:   . Feeling of Stress : Not on file  Social Connections:   . Frequency of Communication with Friends and Family: Not on file  . Frequency of Social Gatherings with Friends and Family: Not on file  . Attends Religious Services: Not on file  . Active Member of Clubs or Organizations: Not on file  . Attends Archivist Meetings: Not on file  . Marital Status: Not on file  Intimate Partner Violence:   . Fear of Current or Ex-Partner: Not on file  . Emotionally Abused: Not on file  . Physically Abused: Not on file  . Sexually Abused: Not on file     Allergies  Allergen Reactions  . Avelox [Moxifloxacin Hcl In Nacl]   . Ciprofloxacin   . Doxycycline Diarrhea  . Floxin [Ofloxacin]   . Iohexol      Code: HIVES, Desc: PER RHONDA [place] @ DR SOOD OFFICE, 05/04/07 (PT IS ALLERGIC TO CONTRAST) CHANGE TO W/O CM. RM, Onset Date: [id number]   . Levaquin [Levofloxacin In D5w]   . Maxaquin [Lomefloxacin]   . Noroxin [Norfloxacin]   . Quinolones   . Tequin [Gatifloxacin]   . Trovan [Alatrofloxacin]   . Zagam [Sparfloxacin]      Immunization History  Administered Date(s) Administered  . Fluad Quad(high Dose 65+) 07/16/2020  . Influenza, High Dose Seasonal PF 07/22/2017, 07/09/2018, 07/24/2019  . Moderna SARS-COVID-2 Vaccination 11/30/2019, 12/02/2019  . Pneumococcal Conjugate-13 10/08/2014  . Pneumococcal Polysaccharide-23 10/20/2007, 05/19/2012    Outpatient Medications Prior to Visit  Medication Sig Dispense Refill  . B Complex Vitamins (B COMPLEX PO) Take by mouth daily.    Marland Kitchen BIOTIN PO Take 1 capsule by mouth daily.    Chong Sicilian, Borago officinalis, (BORAGE OIL) 1000 MG CAPS Take 1 capsule by mouth daily.    . Calcium Carbonate (CALCIUM 600 PO) Take 1 tablet by mouth 2 (two) times daily.    . Estradiol (VAGIFEM) 10 MCG TABS vaginal tablet Place 10 mcg vaginally 2 (two) times a week.    . ezetimibe (ZETIA) 10 MG tablet Take 10 mg by mouth daily.    Marland Kitchen HAWTHORN EXTRACT PO  Take 600 mg by mouth daily.    Marland Kitchen MAGNESIUM PO Take by mouth daily.    Marland Kitchen OVER THE COUNTER MEDICATION Take 1 capsule by mouth daily.    Marland Kitchen Respiratory Therapy Supplies (FLUTTER) DEVI USE 3-4 TIMES DAILY AS NEEDED. 1 each 0  . sodium chloride HYPERTONIC 3 % nebulizer solution Take by nebulization daily. 750 mL 12  . mometasone-formoterol (DULERA) 200-5 MCG/ACT AERO  Inhale 2 puffs into the lungs 2 (two) times daily.    Marland Kitchen umeclidinium-vilanterol (ANORO ELLIPTA) 62.5-25 MCG/INH AEPB Inhale 1 puff into the lungs daily. 60 each 5  . diphenhydrAMINE (ALLERGY RELIEF) 25 mg capsule Take 25 mg by mouth at bedtime. (Patient not taking: Reported on 08/10/2020)     No facility-administered medications prior to visit.    Review of Systems  Constitutional: Negative for chills, fever, malaise/fatigue and weight loss.  HENT: Negative for congestion.   Cardiovascular: Negative for chest pain and leg swelling.  Gastrointestinal: Negative for nausea and vomiting.  Endo/Heme/Allergies: Positive for environmental allergies.     Objective:   Vitals:   08/10/20 1043  BP: 136/80  Pulse: 75  Temp: (!) 97.5 F (36.4 C)  SpO2: 99%  Weight: 119 lb 3.2 oz (54.1 kg)  Height: 5\' 4"  (1.626 m)   99% on  RA BMI Readings from Last 3 Encounters:  08/10/20 20.46 kg/m  04/03/20 20.80 kg/m  02/21/20 20.62 kg/m   Wt Readings from Last 3 Encounters:  08/10/20 119 lb 3.2 oz (54.1 kg)  04/03/20 117 lb 6.4 oz (53.3 kg)  02/21/20 116 lb 6.4 oz (52.8 kg)    Physical Exam Vitals reviewed.  Constitutional:      General: She is not in acute distress.    Appearance: She is not ill-appearing.  HENT:     Head: Normocephalic and atraumatic.  Eyes:     General: No scleral icterus. Cardiovascular:     Rate and Rhythm: Normal rate and regular rhythm.     Heart sounds: No murmur heard.   Pulmonary:     Comments: Scattered inspiratory squeaks and rales in the bases, otherwise clear to auscultation.  Speaking in full  sentences, no conversational dyspnea.  No observed coughing. Abdominal:     General: There is no distension.     Palpations: Abdomen is soft.     Tenderness: There is no abdominal tenderness.  Musculoskeletal:        General: No deformity.     Cervical back: Neck supple.  Lymphadenopathy:     Cervical: No cervical adenopathy.  Skin:    General: Skin is warm and dry.     Findings: No rash.  Neurological:     General: No focal deficit present.     Mental Status: She is alert.     Coordination: Coordination normal.  Psychiatric:        Mood and Affect: Mood normal.        Behavior: Behavior normal.      CBC    Component Value Date/Time   WBC 8.1 02/27/2017 1240   RBC 5.57 (H) 02/27/2017 1240   HGB 15.9 (H) 02/27/2017 1240   HCT 47.8 (H) 02/27/2017 1240   PLT 318.0 02/27/2017 1240   MCV 85.8 02/27/2017 1240   MCHC 33.2 02/27/2017 1240   RDW 13.9 02/27/2017 1240   LYMPHSABS 2.0 02/27/2017 1240   MONOABS 0.7 02/27/2017 1240   EOSABS 0.1 02/27/2017 1240   BASOSABS 0.1 02/27/2017 1240    CHEMISTRY No results for input(s): NA, K, CL, CO2, GLUCOSE, BUN, CREATININE, CALCIUM, MG, PHOS in the last 168 hours. CrCl cannot be calculated (No successful lab value found.).  2019 labs: IgM 181 IgG 980 IgA 168 Alpha 1 antitrypsin 158, PI*MM  Anti-SSA, SSB negative Anti-SCL 70 negative Antinuclear antibody negative Anti ds DNA antibody negative  Allergy testing 02/2017: + Cats, grasses, ragweed  Micro:  04/04/2020 AFB-negative  Chest Imaging- films reviewed:  CT chest 05/11/2018-bronchiectasis most notable in right upper, right middle, lingula, and minimally left upper lobe.  Scattered tree-in-bud opacities.  Likely mucus infections in lingula and RML.  Scattered tree-in-bud opacities in dependent lower lobes.  Pulmonary Functions Testing Results: PFT Results Latest Ref Rng & Units 01/27/2018  FVC-Pre L 1.68  FVC-Predicted Pre % 62  FVC-Post L 1.62  FVC-Predicted Post % 59   Pre FEV1/FVC % % 78  Post FEV1/FCV % % 81  FEV1-Pre L 1.31  FEV1-Predicted Pre % 64  FEV1-Post L 1.30  DLCO uncorrected ml/min/mmHg 18.85  DLCO UNC% % 78  DLVA Predicted % 126  TLC L 4.02  TLC % Predicted % 80  RV % Predicted % 100   No significant obstruction or bronchodilator reversibility.  No significant restriction, air trapping, or hyperinflation.  Diffusion mildly impaired.  Flow volume loop supports mixed restriction obstruction with improvement of obstruction postbronchodilator.       Assessment & Plan:     ICD-10-CM   1. Bronchiectasis without complication (HCC)  E17.4     Multi- lobar bronchiectasis, most prominent in right middle lobe and lingula- very stable recently. -Continue Anoro once daily.  Previously transitioned off [place]. -Continue hypertonic saline and flutter valve once daily.  She should increase this to twice daily if she has any deterioration in her pulmonary symptoms. -Continue regular physical activity to maintain exercise tolerance -Up-to-date on pneumonia, flu, Covid vaccines.   RTC in 6 months with Dr. Silas Flood.    Current Outpatient Medications:  .  B Complex Vitamins (B COMPLEX PO), Take by mouth daily., Disp: , Rfl:  .  BIOTIN PO, Take 1 capsule by mouth daily., Disp: , Rfl:  .  Borage, Borago officinalis, (BORAGE OIL) 1000 MG CAPS, Take 1 capsule by mouth daily., Disp: , Rfl:  .  Calcium Carbonate (CALCIUM 600 PO), Take 1 tablet by mouth 2 (two) times daily., Disp: , Rfl:  .  Estradiol (VAGIFEM) 10 MCG TABS vaginal tablet, Place 10 mcg vaginally 2 (two) times a week., Disp: , Rfl:  .  ezetimibe (ZETIA) 10 MG tablet, Take 10 mg by mouth daily., Disp: , Rfl:  .  HAWTHORN EXTRACT PO, Take 600 mg by mouth daily., Disp: , Rfl:  .  MAGNESIUM PO, Take by mouth daily., Disp: , Rfl:  .  OVER THE COUNTER MEDICATION, Take 1 capsule by mouth daily., Disp: , Rfl:  .  Respiratory Therapy Supplies (FLUTTER) DEVI, USE 3-4 TIMES DAILY AS NEEDED.,  Disp: 1 each, Rfl: 0 .  sodium chloride HYPERTONIC 3 % nebulizer solution, Take by nebulization daily., Disp: 750 mL, Rfl: 12 .  umeclidinium-vilanterol (ANORO ELLIPTA) 62.5-25 MCG/INH AEPB, Inhale 1 puff into the lungs daily., Disp: 60 each, Rfl: 11 .  diphenhydrAMINE (ALLERGY RELIEF) 25 mg capsule, Take 25 mg by mouth at bedtime. (Patient not taking: Reported on 08/10/2020), Disp: , Rfl:     Julian Hy, DO [facility] 08/10/2020 10:45 AM

## 2020-08-10 NOTE — Patient Instructions (Addendum)
Thank you for visiting Dr. Carlis Abbott at [facility]. We recommend the following:   Keep using flutter valve and hypertonic saline once daily. Increase if your symptoms are worsening.  Meds ordered this encounter  Medications  . umeclidinium-vilanterol (ANORO ELLIPTA) 62.5-25 MCG/INH AEPB    Sig: Inhale 1 puff into the lungs daily.    Dispense:  60 each    Refill:  11    Order Specific Question:   Lot Number?    Answer:   [id number]    Order Specific Question:   Expiration Date?    Answer:   05/06/2021    Order Specific Question:   Manufacturer?    Answer:   [employer] [[id number]]    Order Specific Question:   Quantity    Answer:   1    Return in about 6 months (around 02/07/2021). with Dr. Silas Flood (30 minutes visit).    Please do your part to reduce the spread of COVID-19.

## 2020-08-14 DIAGNOSIS — M25552 Pain in left hip: Secondary | ICD-10-CM | POA: Diagnosis not present

## 2020-08-14 DIAGNOSIS — M25551 Pain in right hip: Secondary | ICD-10-CM | POA: Diagnosis not present

## 2020-08-14 DIAGNOSIS — M6281 Muscle weakness (generalized): Secondary | ICD-10-CM | POA: Diagnosis not present

## 2020-08-22 DIAGNOSIS — I10 Essential (primary) hypertension: Secondary | ICD-10-CM | POA: Diagnosis not present

## 2020-08-22 DIAGNOSIS — Z6820 Body mass index (BMI) 20.0-20.9, adult: Secondary | ICD-10-CM | POA: Diagnosis not present

## 2020-08-22 DIAGNOSIS — K5792 Diverticulitis of intestine, part unspecified, without perforation or abscess without bleeding: Secondary | ICD-10-CM | POA: Diagnosis not present

## 2020-10-04 ENCOUNTER — Telehealth: Payer: Self-pay | Admitting: Internal Medicine

## 2020-10-04 NOTE — Telephone Encounter (Signed)
Received a fax from pt's pharmacy stating that pt's sodium chloride 3% sol is currently on backorder and is unavailable to them through several wholesalers. Pharmacy is requesting an alternative to be prescribed.  MR, please advise.

## 2020-10-12 NOTE — Telephone Encounter (Signed)
Unfortuantely - there is no other alternative to saline but she can do other measures to keep mucus loose and able to cough out   - continue flutter valve  - take mucines otc 1-2 daily if not taking it  - if there is lot of mucus -> we can try to get her a vest     Current Outpatient Medications:  .  B Complex Vitamins (B COMPLEX PO), Take by mouth daily., Disp: , Rfl:  .  BIOTIN PO, Take 1 capsule by mouth daily., Disp: , Rfl:  .  Borage, Borago officinalis, (BORAGE OIL) 1000 MG CAPS, Take 1 capsule by mouth daily., Disp: , Rfl:  .  Calcium Carbonate (CALCIUM 600 PO), Take 1 tablet by mouth 2 (two) times daily., Disp: , Rfl:  .  diphenhydrAMINE (ALLERGY RELIEF) 25 mg capsule, Take 25 mg by mouth at bedtime. (Patient not taking: Reported on 08/10/2020), Disp: , Rfl:  .  Estradiol (VAGIFEM) 10 MCG TABS vaginal tablet, Place 10 mcg vaginally 2 (two) times a week., Disp: , Rfl:  .  ezetimibe (ZETIA) 10 MG tablet, Take 10 mg by mouth daily., Disp: , Rfl:  .  HAWTHORN EXTRACT PO, Take 600 mg by mouth daily., Disp: , Rfl:  .  MAGNESIUM PO, Take by mouth daily., Disp: , Rfl:  .  OVER THE COUNTER MEDICATION, Take 1 capsule by mouth daily., Disp: , Rfl:  .  Respiratory Therapy Supplies (FLUTTER) DEVI, USE 3-4 TIMES DAILY AS NEEDED., Disp: 1 each, Rfl: 0 .  sodium chloride HYPERTONIC 3 % nebulizer solution, Take by nebulization daily., Disp: 750 mL, Rfl: 12 .  umeclidinium-vilanterol (ANORO ELLIPTA) 62.5-25 MCG/INH AEPB, Inhale 1 puff into the lungs daily., Disp: 60 each, Rfl: 11

## 2020-10-12 NOTE — Telephone Encounter (Signed)
Attempted to call pt but unable to reach. Left message for her to return call. 

## 2021-01-14 DIAGNOSIS — K5732 Diverticulitis of large intestine without perforation or abscess without bleeding: Secondary | ICD-10-CM | POA: Diagnosis not present

## 2021-01-14 DIAGNOSIS — E559 Vitamin D deficiency, unspecified: Secondary | ICD-10-CM | POA: Diagnosis not present

## 2021-01-14 DIAGNOSIS — E782 Mixed hyperlipidemia: Secondary | ICD-10-CM | POA: Diagnosis not present

## 2021-01-14 DIAGNOSIS — K5900 Constipation, unspecified: Secondary | ICD-10-CM | POA: Diagnosis not present

## 2021-01-14 DIAGNOSIS — Z79899 Other long term (current) drug therapy: Secondary | ICD-10-CM | POA: Diagnosis not present

## 2021-01-23 DIAGNOSIS — D2262 Melanocytic nevi of left upper limb, including shoulder: Secondary | ICD-10-CM | POA: Diagnosis not present

## 2021-01-23 DIAGNOSIS — Z85820 Personal history of malignant melanoma of skin: Secondary | ICD-10-CM | POA: Diagnosis not present

## 2021-01-23 DIAGNOSIS — D2272 Melanocytic nevi of left lower limb, including hip: Secondary | ICD-10-CM | POA: Diagnosis not present

## 2021-01-23 DIAGNOSIS — L821 Other seborrheic keratosis: Secondary | ICD-10-CM | POA: Diagnosis not present

## 2021-01-23 DIAGNOSIS — D1801 Hemangioma of skin and subcutaneous tissue: Secondary | ICD-10-CM | POA: Diagnosis not present

## 2021-01-24 DIAGNOSIS — I10 Essential (primary) hypertension: Secondary | ICD-10-CM | POA: Diagnosis not present

## 2021-01-24 DIAGNOSIS — Z6820 Body mass index (BMI) 20.0-20.9, adult: Secondary | ICD-10-CM | POA: Diagnosis not present

## 2021-01-24 DIAGNOSIS — E782 Mixed hyperlipidemia: Secondary | ICD-10-CM | POA: Diagnosis not present

## 2021-02-20 ENCOUNTER — Ambulatory Visit (INDEPENDENT_AMBULATORY_CARE_PROVIDER_SITE_OTHER): Payer: Medicare Other | Admitting: Plastic Surgery

## 2021-02-20 ENCOUNTER — Encounter: Payer: Self-pay | Admitting: Plastic Surgery

## 2021-02-20 ENCOUNTER — Other Ambulatory Visit: Payer: Self-pay

## 2021-02-20 VITALS — BP 158/108 | HR 71 | Ht 63.75 in | Wt 118.0 lb

## 2021-02-20 DIAGNOSIS — T8544XA Capsular contracture of breast implant, initial encounter: Secondary | ICD-10-CM | POA: Diagnosis not present

## 2021-02-20 NOTE — Progress Notes (Signed)
Referring Provider Ronita Hipps, MD [address],[place] [address],    CC:  Chief Complaint  Patient presents with  . Advice Only      Kristy [name] is an 80 y.o. female.  HPI: Patient resents to discuss her breast implants.  She is now having much of a problem with them and is generally pleased with how they look but they have been put in quite some time ago and she wants to make sure that nothing needs to be done.  This started with a left mastectomy that was done in the 80s which was reconstructed and subsequently she had a right mastectomy which was reconstructed as well.  She initially had saline implants in the 80s which were subsequently switched to silicone implants in the late 80s.  At some point down the line the silicone gel implants were removed and replaced with saline implants by Dr. Dessie Coma.  She feels that she has developed a little bit of firmness around the implant but nothing that is uncomfortable for her.  She believes her implants are submuscular.  She never required any chemotherapy or radiation therapy.  Allergies  Allergen Reactions  . Avelox [Moxifloxacin Hcl In Nacl]   . Ciprofloxacin   . Doxycycline Diarrhea  . Floxin [Ofloxacin]   . Iohexol      Code: HIVES, Desc: PER RHONDA [place] @ DR SOOD OFFICE, 05/04/07 (PT IS ALLERGIC TO CONTRAST) CHANGE TO W/O CM. RM, Onset Date: [id number]   . Levaquin [Levofloxacin In D5w]   . Maxaquin [Lomefloxacin]   . Noroxin [Norfloxacin]   . Quinolones   . Tequin [Gatifloxacin]   . Trovan [Alatrofloxacin]   . Zagam [Sparfloxacin]     Outpatient Encounter Medications as of 02/20/2021  Medication Sig  . B Complex Vitamins (B COMPLEX PO) Take by mouth daily.  Marland Kitchen BIOTIN PO Take 1 capsule by mouth daily.  Chong Sicilian, Borago officinalis, (BORAGE OIL) 1000 MG CAPS Take 1 capsule by mouth daily.  . Calcium Carbonate (CALCIUM 600 PO) Take 1 tablet by mouth 2 (two) times daily.  . Estradiol 10 MCG TABS vaginal tablet  Place 10 mcg vaginally 2 (two) times a week.  . ezetimibe (ZETIA) 10 MG tablet Take 10 mg by mouth daily.  Marland Kitchen HAWTHORN EXTRACT PO Take 600 mg by mouth daily.  Marland Kitchen MAGNESIUM PO Take by mouth daily.  Marland Kitchen OVER THE COUNTER MEDICATION Take 1 capsule by mouth daily.  Marland Kitchen Respiratory Therapy Supplies (FLUTTER) DEVI USE 3-4 TIMES DAILY AS NEEDED.  . sodium chloride HYPERTONIC 3 % nebulizer solution Take by nebulization daily.  . [DISCONTINUED] diphenhydrAMINE (ALLERGY RELIEF) 25 mg capsule Take 25 mg by mouth at bedtime. (Patient not taking: Reported on 08/10/2020)  . [DISCONTINUED] umeclidinium-vilanterol (ANORO ELLIPTA) 62.5-25 MCG/INH AEPB Inhale 1 puff into the lungs daily.   No facility-administered encounter medications on file as of 02/20/2021.     Past Medical History:  Diagnosis Date  . Asthma     Past Surgical History:  Procedure Laterality Date  . APPENDECTOMY    . hesterectomy    . MASTECTOMY Bilateral     Family History  Problem Relation Age of Onset  . Allergic rhinitis Neg Hx   . Angioedema Neg Hx   . Asthma Neg Hx   . Atopy Neg Hx   . Eczema Neg Hx   . Immunodeficiency Neg Hx   . Urticaria Neg Hx     Social History   Social History Narrative  .  Not on file     Review of Systems General: Denies fevers, chills, weight loss CV: Denies chest pain, shortness of breath, palpitations  Physical Exam Vitals with BMI 02/20/2021 08/10/2020 04/03/2020  Height 5' 3.75" 5\' 4"  5\' 3"   Weight 118 lbs 119 lbs 3 oz 117 lbs 6 oz  BMI 20.42 38.10 17.5  Systolic 102 585 277  Diastolic 824 80 82  Pulse 71 75 78    General:  No acute distress,  Alert and oriented, Non-Toxic, Normal speech and affect Examination shows bilateral breast reconstruction with submuscular implants.  There is a transverse central scars on both sides.  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  The overall position of the implants is reasonably  symmetric and appropriate.  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  This not particularly bothersome to her.  I gave her the option on how to manage this but ultimately she is pretty content with her implants as they are and would rather avoid surgery if possible.  I think it is totally fine in her case to postpone any surgical intervention and if she does develop more pain or tenderness or dissatisfaction with the appearance I will plan to see her back at that time.  All of her questions were answered.  Cindra Presume 02/20/2021, 11:25 AM

## 2021-03-01 DIAGNOSIS — J40 Bronchitis, not specified as acute or chronic: Secondary | ICD-10-CM | POA: Diagnosis not present

## 2021-04-09 DIAGNOSIS — Z20822 Contact with and (suspected) exposure to covid-19: Secondary | ICD-10-CM | POA: Diagnosis not present

## 2021-05-07 DIAGNOSIS — H35372 Puckering of macula, left eye: Secondary | ICD-10-CM | POA: Diagnosis not present

## 2021-05-07 DIAGNOSIS — H43811 Vitreous degeneration, right eye: Secondary | ICD-10-CM | POA: Diagnosis not present

## 2021-05-07 DIAGNOSIS — H524 Presbyopia: Secondary | ICD-10-CM | POA: Diagnosis not present

## 2021-05-07 DIAGNOSIS — H31091 Other chorioretinal scars, right eye: Secondary | ICD-10-CM | POA: Diagnosis not present

## 2021-05-16 ENCOUNTER — Encounter: Payer: Self-pay | Admitting: Pulmonary Disease

## 2021-05-16 ENCOUNTER — Other Ambulatory Visit: Payer: Self-pay

## 2021-05-16 ENCOUNTER — Ambulatory Visit (INDEPENDENT_AMBULATORY_CARE_PROVIDER_SITE_OTHER): Payer: Medicare Other | Admitting: Pulmonary Disease

## 2021-05-16 VITALS — BP 122/74 | HR 71 | Temp 97.3°F | Ht 63.75 in | Wt 116.6 lb

## 2021-05-16 DIAGNOSIS — J4540 Moderate persistent asthma, uncomplicated: Secondary | ICD-10-CM

## 2021-05-16 DIAGNOSIS — J479 Bronchiectasis, uncomplicated: Secondary | ICD-10-CM | POA: Diagnosis not present

## 2021-05-16 MED ORDER — ALBUTEROL SULFATE HFA 108 (90 BASE) MCG/ACT IN AERS: 2.0000 | INHALATION_SPRAY | Freq: Four times a day (QID) | RESPIRATORY_TRACT | 6 refills | Status: DC | PRN

## 2021-05-16 MED ORDER — SODIUM CHLORIDE 3 % IN NEBU: INHALATION_SOLUTION | Freq: Every day | RESPIRATORY_TRACT | 12 refills | Status: DC

## 2021-05-16 NOTE — Patient Instructions (Addendum)
Ok to stay off Anoro  Try albuterol 2 puffs as needed when you feel like you can not get a deep breath or feel like the chest is tight.  We refilled the saline nebulizer solution.  Return to clinic in 1 year

## 2021-05-16 NOTE — Progress Notes (Signed)
Synopsis: Referred in 2018 for bronchitis by Ronita Hipps, MD.  Previously a patient of Dr. Melvyn Novas, Dr. Chase Caller, and Dr. Carlis Abbott.  Subjective:   PATIENT ID: Kristy [name] GENDER: female DOB: [birth year], MRN: [id number]  Chief Complaint  Patient presents with   Follow-up   New Patient (Initial Visit)    Doing well, winded at times with walking inclines    Kristy [name] is a 80 y.o.woman with a history of multi lobar bronchiectasis who presents for follow-up.  Most recent pulmonary note with Dr. Carlis Abbott reviewed.  She continues to do quite well.  At last visit with Dr. Carlis Abbott she discussed issues with bowels.  Previously she had had no issues.  Couple days later she noted some abdominal cramping.  She noted 3-4 stools every morning.  She stopped the Anoro.  Ever since then, she has had regular daily bowel movements.  No issues with cramping or bowel discomfort.  She continues hypertonic saline in the evenings.  This continues to be very helpful for her.  She notes using postural drainage prior to hypertonic saline and constant mucus doing this.  This is performed via yoga.  No exacerbations.  She continues regular exercise without issues.  She does endorse intermittent chest tightness often with exertion around the house.  No pain, its associated more with shortness of breath.       Flowsheet data:  Lab Results  Component Value Date   NITRICOXIDE 26 02/27/2017    Past Medical History:  Diagnosis Date   Asthma      Family History  Problem Relation Age of Onset   Allergic rhinitis Neg Hx    Angioedema Neg Hx    Asthma Neg Hx    Atopy Neg Hx    Eczema Neg Hx    Immunodeficiency Neg Hx    Urticaria Neg Hx      Past Surgical History:  Procedure Laterality Date   APPENDECTOMY     hesterectomy     MASTECTOMY Bilateral     Social History   Socioeconomic History   Marital status: Married    Spouse name: Not on file   Number of children: Not on file   Years of education:  Not on file   Highest education level: Not on file  Occupational History   Not on file  Tobacco Use   Smoking status: Never   Smokeless tobacco: Never  Vaping Use   Vaping Use: Never used  Substance and Sexual Activity   Alcohol use: No    Alcohol/week: 0.0 standard drinks   Drug use: No   Sexual activity: Not on file  Other Topics Concern   Not on file  Social History Narrative   Not on file   Social Determinants of Health   Financial Resource Strain: Not on file  Food Insecurity: Not on file  Transportation Needs: Not on file  Physical Activity: Not on file  Stress: Not on file  Social Connections: Not on file  Intimate Partner Violence: Not on file     Allergies  Allergen Reactions   Avelox [Moxifloxacin Hcl In Nacl]    Ciprofloxacin    Doxycycline Diarrhea   Floxin [Ofloxacin]    Iohexol      Code: HIVES, Desc: PER RHONDA Ashkum @ DR [facility] OFFICE, 05/04/07 (PT IS ALLERGIC TO CONTRAST) CHANGE TO W/O CM. RM, Onset Date: [id number]    Levaquin [Levofloxacin In D5w]    Maxaquin [Lomefloxacin]    Noroxin [Norfloxacin]  Quinolones    Tequin [Gatifloxacin]    Trovan [Alatrofloxacin]    Zagam [Sparfloxacin]      Immunization History  Administered Date(s) Administered   Fluad Quad(high Dose 65+) 07/16/2020   Influenza, High Dose Seasonal PF 07/22/2017, 07/09/2018, 07/24/2019   Moderna Sars-Covid-2 Vaccination 11/30/2019, 12/02/2019   Pneumococcal Conjugate-13 10/08/2014   Pneumococcal Polysaccharide-23 10/20/2007, 05/19/2012    Outpatient Medications Prior to Visit  Medication Sig Dispense Refill   B Complex Vitamins (B COMPLEX PO) Take by mouth daily.     BIOTIN PO Take 1 capsule by mouth daily.     Borage, Borago officinalis, (BORAGE OIL) 1000 MG CAPS Take 1 capsule by mouth daily.     Calcium Carbonate (CALCIUM 600 PO) Take 1 tablet by mouth 2 (two) times daily.     Estradiol 10 MCG TABS vaginal tablet Place 10 mcg vaginally 2 (two) times a week.     ezetimibe  (ZETIA) 10 MG tablet Take 10 mg by mouth daily.     MAGNESIUM PO Take by mouth daily.     OVER THE COUNTER MEDICATION Take 1 capsule by mouth daily.     Respiratory Therapy Supplies (FLUTTER) DEVI USE 3-4 TIMES DAILY AS NEEDED. 1 each 0   sodium chloride HYPERTONIC 3 % nebulizer solution Take by nebulization daily. 750 mL 12   HAWTHORN EXTRACT PO Take 600 mg by mouth daily.     No facility-administered medications prior to visit.    Review of Systems  Constitutional:  Negative for chills, fever, malaise/fatigue and weight loss.  HENT:  Negative for congestion.   Cardiovascular:  Negative for chest pain and leg swelling.  Gastrointestinal:  Negative for nausea and vomiting.  Endo/Heme/Allergies:  Positive for environmental allergies.    Objective:   Vitals:   05/16/21 1057  BP: 122/74  Pulse: 71  Temp: (!) 97.3 F (36.3 C)  TempSrc: Oral  SpO2: 99%  Weight: 116 lb 9.6 oz (52.9 kg)  Height: 5' 3.75" (1.619 m)   99% on  RA BMI Readings from Last 3 Encounters:  05/16/21 20.17 kg/m  02/20/21 20.41 kg/m  08/10/20 20.46 kg/m   Wt Readings from Last 3 Encounters:  05/16/21 116 lb 9.6 oz (52.9 kg)  02/20/21 118 lb (53.5 kg)  08/10/20 119 lb 3.2 oz (54.1 kg)    Physical Exam Vitals reviewed.  Constitutional:      General: She is not in acute distress.    Appearance: She is not ill-appearing.  HENT:     Head: Normocephalic and atraumatic.  Eyes:     General: No scleral icterus. Cardiovascular:     Rate and Rhythm: Normal rate and regular rhythm.     Heart sounds: No murmur heard. Pulmonary:     Comments: Scattered inspiratory squeaks and rales in the bases, otherwise clear to auscultation.  Speaking in full sentences, no conversational dyspnea.  No observed coughing. Abdominal:     General: There is no distension.     Palpations: Abdomen is soft.     Tenderness: There is no abdominal tenderness.  Musculoskeletal:        General: No deformity.     Cervical back:  Neck supple.  Lymphadenopathy:     Cervical: No cervical adenopathy.  Skin:    General: Skin is warm and dry.     Findings: No rash.  Neurological:     General: No focal deficit present.     Mental Status: She is alert.     Coordination: Coordination  normal.  Psychiatric:        Mood and Affect: Mood normal.        Behavior: Behavior normal.     CBC    Component Value Date/Time   WBC 8.1 02/27/2017 1240   RBC 5.57 (H) 02/27/2017 1240   HGB 15.9 (H) 02/27/2017 1240   HCT 47.8 (H) 02/27/2017 1240   PLT 318.0 02/27/2017 1240   MCV 85.8 02/27/2017 1240   MCHC 33.2 02/27/2017 1240   RDW 13.9 02/27/2017 1240   LYMPHSABS 2.0 02/27/2017 1240   MONOABS 0.7 02/27/2017 1240   EOSABS 0.1 02/27/2017 1240   BASOSABS 0.1 02/27/2017 1240    CHEMISTRY No results for input(s): NA, K, CL, CO2, GLUCOSE, BUN, CREATININE, CALCIUM, MG, PHOS in the last 168 hours. CrCl cannot be calculated (No successful lab value found.).  2019 labs: IgM 181 IgG 980 IgA 168 Alpha 1 antitrypsin 158, PI*MM  Anti-SSA, SSB negative Anti-SCL 70 negative Antinuclear antibody negative Anti ds DNA antibody negative  Allergy testing 02/2017: + Cats, grasses, ragweed  Micro:  04/04/2020 AFB-negative  Chest Imaging- films reviewed: CT chest 05/11/2018-bronchiectasis most notable in right upper, right middle, lingula, and minimally left upper lobe.  Scattered tree-in-bud opacities.  Likely mucus infections in lingula and RML.  Scattered tree-in-bud opacities in dependent lower lobes.  Pulmonary Functions Testing Results: PFT Results Latest Ref Rng & Units 01/27/2018  FVC-Pre L 1.68  FVC-Predicted Pre % 62  FVC-Post L 1.62  FVC-Predicted Post % 59  Pre FEV1/FVC % % 78  Post FEV1/FCV % % 81  FEV1-Pre L 1.31  FEV1-Predicted Pre % 64  FEV1-Post L 1.30  DLCO uncorrected ml/min/mmHg 18.85  DLCO UNC% % 78  DLVA Predicted % 126  TLC L 4.02  TLC % Predicted % 80  RV % Predicted % 100   No significant  obstruction or bronchodilator reversibility.  No significant restriction, air trapping, or hyperinflation.  Diffusion mildly impaired.  Flow volume loop supports mixed restriction obstruction with improvement of obstruction postbronchodilator.       Assessment & Plan:     ICD-10-CM   1. Obstructive bronchiectasis (HCC)  J47.9     2. Moderate persistent asthma without complication  123456       Multi- lobar bronchiectasis, most prominent in right middle lobe and lingula- very stable recently. -Continue hypertonic saline (refilled today) and flutter valve once daily.  She should increase this to twice daily if she has any deterioration in her pulmonary symptoms. -Continue regular physical activity to maintain exercise tolerance -Up-to-date on pneumonia, flu, Covid vaccines.  Asthma: Well controlled. No controller medications. Stopped anoro and relatively new onset chest tightness, DOE. Mild overall. Wonder if related to stopping LAMA/LABA therapy. Trial PRN albuterol, prescription sent.    RTC in 12 months with Dr. Silas Flood.    Current Outpatient Medications:    albuterol (VENTOLIN HFA) 108 (90 Base) MCG/ACT inhaler, Inhale 2 puffs into the lungs every 6 (six) hours as needed for wheezing or shortness of breath (chest tightness)., Disp: 1 each, Rfl: 6   B Complex Vitamins (B COMPLEX PO), Take by mouth daily., Disp: , Rfl:    BIOTIN PO, Take 1 capsule by mouth daily., Disp: , Rfl:    Borage, Borago officinalis, (BORAGE OIL) 1000 MG CAPS, Take 1 capsule by mouth daily., Disp: , Rfl:    Calcium Carbonate (CALCIUM 600 PO), Take 1 tablet by mouth 2 (two) times daily., Disp: , Rfl:    Estradiol 10 MCG TABS  vaginal tablet, Place 10 mcg vaginally 2 (two) times a week., Disp: , Rfl:    ezetimibe (ZETIA) 10 MG tablet, Take 10 mg by mouth daily., Disp: , Rfl:    MAGNESIUM PO, Take by mouth daily., Disp: , Rfl:    OVER THE COUNTER MEDICATION, Take 1 capsule by mouth daily., Disp: , Rfl:     Respiratory Therapy Supplies (FLUTTER) DEVI, USE 3-4 TIMES DAILY AS NEEDED., Disp: 1 each, Rfl: 0   sodium chloride HYPERTONIC 3 % nebulizer solution, Take by nebulization daily., Disp: 750 mL, Rfl: 12    Lanier Clam, MD [facility] 05/16/2021 12:15 PM

## 2021-07-16 DIAGNOSIS — E782 Mixed hyperlipidemia: Secondary | ICD-10-CM | POA: Diagnosis not present

## 2021-07-16 DIAGNOSIS — Z79899 Other long term (current) drug therapy: Secondary | ICD-10-CM | POA: Diagnosis not present

## 2021-07-23 DIAGNOSIS — Z23 Encounter for immunization: Secondary | ICD-10-CM | POA: Diagnosis not present

## 2021-07-29 DIAGNOSIS — Z0000 Encounter for general adult medical examination without abnormal findings: Secondary | ICD-10-CM | POA: Diagnosis not present

## 2021-07-29 DIAGNOSIS — Z1331 Encounter for screening for depression: Secondary | ICD-10-CM | POA: Diagnosis not present

## 2021-07-29 DIAGNOSIS — E782 Mixed hyperlipidemia: Secondary | ICD-10-CM | POA: Diagnosis not present

## 2021-07-29 DIAGNOSIS — Z6820 Body mass index (BMI) 20.0-20.9, adult: Secondary | ICD-10-CM | POA: Diagnosis not present

## 2021-11-18 DIAGNOSIS — L820 Inflamed seborrheic keratosis: Secondary | ICD-10-CM | POA: Diagnosis not present

## 2021-11-18 DIAGNOSIS — Z85820 Personal history of malignant melanoma of skin: Secondary | ICD-10-CM | POA: Diagnosis not present

## 2021-11-18 DIAGNOSIS — D485 Neoplasm of uncertain behavior of skin: Secondary | ICD-10-CM | POA: Diagnosis not present

## 2022-02-06 DIAGNOSIS — Z20822 Contact with and (suspected) exposure to covid-19: Secondary | ICD-10-CM | POA: Diagnosis not present

## 2022-02-07 DIAGNOSIS — Z20822 Contact with and (suspected) exposure to covid-19: Secondary | ICD-10-CM | POA: Diagnosis not present

## 2022-02-19 DIAGNOSIS — D1801 Hemangioma of skin and subcutaneous tissue: Secondary | ICD-10-CM | POA: Diagnosis not present

## 2022-02-19 DIAGNOSIS — D2239 Melanocytic nevi of other parts of face: Secondary | ICD-10-CM | POA: Diagnosis not present

## 2022-02-19 DIAGNOSIS — Z85820 Personal history of malignant melanoma of skin: Secondary | ICD-10-CM | POA: Diagnosis not present

## 2022-02-19 DIAGNOSIS — L820 Inflamed seborrheic keratosis: Secondary | ICD-10-CM | POA: Diagnosis not present

## 2022-02-19 DIAGNOSIS — L821 Other seborrheic keratosis: Secondary | ICD-10-CM | POA: Diagnosis not present

## 2022-03-25 DIAGNOSIS — E782 Mixed hyperlipidemia: Secondary | ICD-10-CM | POA: Diagnosis not present

## 2022-04-25 DIAGNOSIS — E782 Mixed hyperlipidemia: Secondary | ICD-10-CM | POA: Diagnosis not present

## 2022-04-25 DIAGNOSIS — Z6820 Body mass index (BMI) 20.0-20.9, adult: Secondary | ICD-10-CM | POA: Diagnosis not present

## 2022-05-13 DIAGNOSIS — H52223 Regular astigmatism, bilateral: Secondary | ICD-10-CM | POA: Diagnosis not present

## 2022-05-13 DIAGNOSIS — H31091 Other chorioretinal scars, right eye: Secondary | ICD-10-CM | POA: Diagnosis not present

## 2022-05-13 DIAGNOSIS — H524 Presbyopia: Secondary | ICD-10-CM | POA: Diagnosis not present

## 2022-05-13 DIAGNOSIS — H43811 Vitreous degeneration, right eye: Secondary | ICD-10-CM | POA: Diagnosis not present

## 2022-05-13 DIAGNOSIS — H35372 Puckering of macula, left eye: Secondary | ICD-10-CM | POA: Diagnosis not present

## 2022-06-02 ENCOUNTER — Ambulatory Visit (INDEPENDENT_AMBULATORY_CARE_PROVIDER_SITE_OTHER): Payer: Medicare Other | Admitting: Pulmonary Disease

## 2022-06-02 ENCOUNTER — Other Ambulatory Visit: Payer: Self-pay | Admitting: Pulmonary Disease

## 2022-06-02 ENCOUNTER — Encounter: Payer: Self-pay | Admitting: Pulmonary Disease

## 2022-06-02 VITALS — BP 120/80 | HR 63 | Temp 97.8°F | Ht 63.75 in | Wt 117.4 lb

## 2022-06-02 DIAGNOSIS — J4520 Mild intermittent asthma, uncomplicated: Secondary | ICD-10-CM | POA: Diagnosis not present

## 2022-06-02 DIAGNOSIS — J479 Bronchiectasis, uncomplicated: Secondary | ICD-10-CM | POA: Diagnosis not present

## 2022-06-02 MED ORDER — SODIUM CHLORIDE 3 % IN NEBU: INHALATION_SOLUTION | Freq: Every day | RESPIRATORY_TRACT | 12 refills | Status: DC

## 2022-06-02 NOTE — Progress Notes (Signed)
Synopsis: Referred in 2018 for bronchitis by Ronita Hipps, MD.  Previously a patient of Dr. Melvyn Novas, Dr. Chase Caller, and Dr. Carlis Abbott.  Subjective:   PATIENT ID: Kristy [name] GENDER: female DOB: 03-May-1941, MRN: [id number]  Chief Complaint  Patient presents with   Follow-up    Pt states she has been doing well since last visit and denies any complaints.    Kristy [name] is a 81 y.o..woman with a history of multi lobar bronchiectasis who presents for follow-up.    She continues to do quite well.  Started use hyperextending twice a day although with the supply chain issues, using every other night.  No worsening cough or symptoms.  She continues to do nightly exercising, yoga, positional/postural drainage.  No dyspnea.  She continues to be an avid exerciser.  Walking couple miles at least 3 times a week.  She is looking forward to getting her COVID booster.  She has about RSV vaccine which I encouraged today.  She asked about potentially undergoing breast augmentation.  She had reconstruction after mastectomy 30 years ago.  Interested in having this updated.  I see no reason why this could not occur.  Do recommend using hypertonic saline twice daily a week prior and after surgery to help prevent mucus accumulation.  She is required no antibiotics, no exacerbations in the interim over the last year.      Flowsheet data:  Lab Results  Component Value Date   NITRICOXIDE 26 02/27/2017    Past Medical History:  Diagnosis Date   Asthma      Family History  Problem Relation Age of Onset   Allergic rhinitis Neg Hx    Angioedema Neg Hx    Asthma Neg Hx    Atopy Neg Hx    Eczema Neg Hx    Immunodeficiency Neg Hx    Urticaria Neg Hx      Past Surgical History:  Procedure Laterality Date   APPENDECTOMY     hesterectomy     MASTECTOMY Bilateral     Social History   Socioeconomic History   Marital status: Married    Spouse name: Not on file   Number of children: Not on file    Years of education: Not on file   Highest education level: Not on file  Occupational History   Not on file  Tobacco Use   Smoking status: Never   Smokeless tobacco: Never  Vaping Use   Vaping Use: Never used  Substance and Sexual Activity   Alcohol use: No    Alcohol/week: 0.0 standard drinks of alcohol   Drug use: No   Sexual activity: Not on file  Other Topics Concern   Not on file  Social History Narrative   Not on file   Social Determinants of Health   Financial Resource Strain: Not on file  Food Insecurity: Not on file  Transportation Needs: Not on file  Physical Activity: Not on file  Stress: Not on file  Social Connections: Not on file  Intimate Partner Violence: Not on file     Allergies  Allergen Reactions   Avelox [Moxifloxacin Hcl In Nacl]    Ciprofloxacin    Doxycycline Diarrhea   Floxin [Ofloxacin]    Iohexol      Code: HIVES, Desc: PER RHONDA Mora @ DR [facility] OFFICE, 05/04/07 (PT IS ALLERGIC TO CONTRAST) CHANGE TO W/O CM. RM, Onset Date: [id number]    Levaquin [Levofloxacin In D5w]    Maxaquin [Lomefloxacin]  Noroxin [Norfloxacin]    Quinolones    Tequin [Gatifloxacin]    Trovan [Alatrofloxacin]    Zagam [Sparfloxacin]      Immunization History  Administered Date(s) Administered   Fluad Quad(high Dose 65+) 07/16/2020   Influenza, High Dose Seasonal PF 07/22/2017, 07/09/2018, 07/24/2019   Moderna Sars-Covid-2 Vaccination 11/30/2019, 12/02/2019   Pneumococcal Conjugate-13 10/08/2014   Pneumococcal Polysaccharide-23 10/20/2007, 05/19/2012    Outpatient Medications Prior to Visit  Medication Sig Dispense Refill   albuterol (VENTOLIN HFA) 108 (90 Base) MCG/ACT inhaler Inhale 2 puffs into the lungs every 6 (six) hours as needed for wheezing or shortness of breath (chest tightness). 1 each 6   B Complex Vitamins (B COMPLEX PO) Take by mouth daily.     BIOTIN PO Take 1 capsule by mouth daily.     Calcium Carbonate (CALCIUM 600 PO) Take 1 tablet by mouth  2 (two) times daily.     Estradiol 10 MCG TABS vaginal tablet Place 10 mcg vaginally 2 (two) times a week.     MAGNESIUM PO Take by mouth daily.     OVER THE COUNTER MEDICATION Take 1 capsule by mouth daily.     Respiratory Therapy Supplies (FLUTTER) DEVI USE 3-4 TIMES DAILY AS NEEDED. 1 each 0   Borage, Borago officinalis, (BORAGE OIL) 1000 MG CAPS Take 1 capsule by mouth daily.     ezetimibe (ZETIA) 10 MG tablet Take 10 mg by mouth daily.     sodium chloride HYPERTONIC 3 % nebulizer solution Take by nebulization daily. 750 mL 12   No facility-administered medications prior to visit.    Review of Systems  Constitutional:  Negative for chills, fever, malaise/fatigue and weight loss.  HENT:  Negative for congestion.   Cardiovascular:  Negative for chest pain and leg swelling.  Gastrointestinal:  Negative for nausea and vomiting.  Endo/Heme/Allergies:  Positive for environmental allergies.     Objective:   Vitals:   06/02/22 1040  BP: 120/80  Pulse: 63  Temp: 97.8 F (36.6 C)  TempSrc: Oral  SpO2: 99%  Weight: 117 lb 6.4 oz (53.3 kg)  Height: 5' 3.75" (1.619 m)   99% on  RA BMI Readings from Last 3 Encounters:  06/02/22 20.31 kg/m  05/16/21 20.17 kg/m  02/20/21 20.41 kg/m   Wt Readings from Last 3 Encounters:  06/02/22 117 lb 6.4 oz (53.3 kg)  05/16/21 116 lb 9.6 oz (52.9 kg)  02/20/21 118 lb (53.5 kg)    Physical Exam Vitals reviewed.  Constitutional:      General: She is not in acute distress.    Appearance: She is not ill-appearing.  HENT:     Head: Normocephalic and atraumatic.  Eyes:     General: No scleral icterus. Cardiovascular:     Rate and Rhythm: Normal rate and regular rhythm.     Heart sounds: No murmur heard. Pulmonary:     Comments: Scattered inspiratory squeaks and rales in the bases, otherwise clear to auscultation.  Speaking in full sentences, no conversational dyspnea.  No observed coughing. Abdominal:     General: There is no  distension.     Palpations: Abdomen is soft.     Tenderness: There is no abdominal tenderness.  Musculoskeletal:        General: No deformity.     Cervical back: Neck supple.  Lymphadenopathy:     Cervical: No cervical adenopathy.  Skin:    General: Skin is warm and dry.     Findings: No rash.  Neurological:     General: No focal deficit present.     Mental Status: She is alert.     Coordination: Coordination normal.  Psychiatric:        Mood and Affect: Mood normal.        Behavior: Behavior normal.      CBC    Component Value Date/Time   WBC 8.1 02/27/2017 1240   RBC 5.57 (H) 02/27/2017 1240   HGB 15.9 (H) 02/27/2017 1240   HCT 47.8 (H) 02/27/2017 1240   PLT 318.0 02/27/2017 1240   MCV 85.8 02/27/2017 1240   MCHC 33.2 02/27/2017 1240   RDW 13.9 02/27/2017 1240   LYMPHSABS 2.0 02/27/2017 1240   MONOABS 0.7 02/27/2017 1240   EOSABS 0.1 02/27/2017 1240   BASOSABS 0.1 02/27/2017 1240    CHEMISTRY No results for input(s): "NA", "K", "CL", "CO2", "GLUCOSE", "BUN", "CREATININE", "CALCIUM", "MG", "PHOS" in the last 168 hours. CrCl cannot be calculated (No successful lab value found.).  2019 labs: IgM 181 IgG 980 IgA 168 Alpha 1 antitrypsin 158, PI*MM  Anti-SSA, SSB negative Anti-SCL 70 negative Antinuclear antibody negative Anti ds DNA antibody negative  Allergy testing 02/2017: + Cats, grasses, ragweed  Micro:  04/04/2020 AFB-negative  Chest Imaging- films reviewed: CT chest 05/11/2018-bronchiectasis most notable in right upper, right middle, lingula, and minimally left upper lobe.  Scattered tree-in-bud opacities.  Likely mucus infections in lingula and RML.  Scattered tree-in-bud opacities in dependent lower lobes.  Pulmonary Functions Testing Results:    Latest Ref Rng & Units 01/27/2018    9:40 AM  PFT Results  FVC-Pre L 1.68   FVC-Predicted Pre % 62   FVC-Post L 1.62   FVC-Predicted Post % 59   Pre FEV1/FVC % % 78   Post FEV1/FCV % % 81   FEV1-Pre  L 1.31   FEV1-Predicted Pre % 64   FEV1-Post L 1.30   DLCO uncorrected ml/min/mmHg 18.85   DLCO UNC% % 78   DLVA Predicted % 126   TLC L 4.02   TLC % Predicted % 80   RV % Predicted % 100    No significant obstruction or bronchodilator reversibility.  No significant restriction, air trapping, or hyperinflation.  Diffusion mildly impaired.  Flow volume loop supports mixed restriction obstruction with improvement of obstruction postbronchodilator.       Assessment & Plan:     ICD-10-CM   1. Obstructive bronchiectasis (HCC)  J47.9     2. Mild intermittent asthma without complication  U93.23       Multi- lobar bronchiectasis, most prominent in right middle lobe and lingula- very stable recently. -Continue hypertonic saline (refilled today) and flutter valve every other day.  She should increase this to twice daily if she has any deterioration in her pulmonary symptoms. -Continue regular physical activity to maintain exercise tolerance -Up-to-date on pneumonia, flu, Covid vaccines, recommend COVID booster as well as far as RSV vaccine  Asthma: Well controlled. No controller medications.  Anoro in the past caused bowel upset, constipation.  Albuterol as needed.   RTC in 12 months with Dr. Silas Flood.    Current Outpatient Medications:    albuterol (VENTOLIN HFA) 108 (90 Base) MCG/ACT inhaler, Inhale 2 puffs into the lungs every 6 (six) hours as needed for wheezing or shortness of breath (chest tightness)., Disp: 1 each, Rfl: 6   B Complex Vitamins (B COMPLEX PO), Take by mouth daily., Disp: , Rfl:    BIOTIN PO, Take 1 capsule by mouth daily.,  Disp: , Rfl:    Calcium Carbonate (CALCIUM 600 PO), Take 1 tablet by mouth 2 (two) times daily., Disp: , Rfl:    Estradiol 10 MCG TABS vaginal tablet, Place 10 mcg vaginally 2 (two) times a week., Disp: , Rfl:    MAGNESIUM PO, Take by mouth daily., Disp: , Rfl:    OVER THE COUNTER MEDICATION, Take 1 capsule by mouth daily., Disp: , Rfl:     Respiratory Therapy Supplies (FLUTTER) DEVI, USE 3-4 TIMES DAILY AS NEEDED., Disp: 1 each, Rfl: 0   sodium chloride HYPERTONIC 3 % nebulizer solution, Take by nebulization daily., Disp: 750 mL, Rfl: 12    Lanier Clam, MD [facility] 06/02/2022 10:54 AM

## 2022-06-02 NOTE — Patient Instructions (Signed)
Nice to see you again!  Surgery would be fine  Would use the saline twice a day a week before and after surgery  Return to clinic in 1 year

## 2022-06-03 NOTE — Telephone Encounter (Signed)
Tried calling the pt to see if there is another pharmacy she would not mind using since [facility] does not have saline neb in stock. Her line rang x 2 and then went to busy signal. Will try back later.

## 2022-06-27 ENCOUNTER — Telehealth: Payer: Self-pay | Admitting: Plastic Surgery

## 2022-06-27 NOTE — Telephone Encounter (Signed)
Left message for pt to call back to reschedule 11/2 appt due to schedule conflict with the provider.

## 2022-07-15 DIAGNOSIS — Z23 Encounter for immunization: Secondary | ICD-10-CM | POA: Diagnosis not present

## 2022-07-21 DIAGNOSIS — Z23 Encounter for immunization: Secondary | ICD-10-CM | POA: Diagnosis not present

## 2022-07-31 ENCOUNTER — Encounter: Payer: Self-pay | Admitting: Plastic Surgery

## 2022-07-31 ENCOUNTER — Ambulatory Visit (INDEPENDENT_AMBULATORY_CARE_PROVIDER_SITE_OTHER): Payer: Medicare Other | Admitting: Plastic Surgery

## 2022-07-31 DIAGNOSIS — Z853 Personal history of malignant neoplasm of breast: Secondary | ICD-10-CM | POA: Diagnosis not present

## 2022-07-31 DIAGNOSIS — Z9013 Acquired absence of bilateral breasts and nipples: Secondary | ICD-10-CM | POA: Diagnosis not present

## 2022-07-31 NOTE — Progress Notes (Signed)
Patient ID: Kristy [name], female    DOB: Feb 23, 1941, 81 y.o.   MRN: [id number]   Chief Complaint  Patient presents with   consult   Breast Problem    The patient is an 81 year old female here for further evaluation of her breasts.  She was diagnosed with left breast cancer in 1985.  She went through a series of biopsies followed by mastectomy and reconstruction over several years.  She had saline implants placed in 1986 and then removed in 1989.  In the meantime in 1987 she had a right mastectomy with reconstruction with saline implants.  Then in 7517 both of the silicone implants were removed and saline implants were placed in both breasts.  She has had several other contracture release surgeries as well.  He is not sure of her size and those records will no longer be available.  She is 5 feet 3 inches tall and weighs 116 pounds.  She is otherwise in very good health.  Her physician was Dr. Dessie Coma and I know that he does not still have those records.  The left breast is slightly firmer than the right.  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  She denies having had any radiation or chemotherapy.  She is overall happy with the size and shape.  She would not mind being slightly smaller and if she had implants replaced she would go with saline implants.  Her husband is ill and not able to drive.  Her only assistance would be from her niece.    Review of Systems  Constitutional: Negative.   HENT: Negative.    Eyes: Negative.   Respiratory: Negative.  Negative for chest tightness and shortness of breath.   Cardiovascular: Negative.  Negative for leg swelling.  Gastrointestinal: Negative.   Endocrine: Negative.   Musculoskeletal: Negative.   Neurological: Negative.   Hematological: Negative.   Psychiatric/Behavioral: Negative.      Past Medical History:  Diagnosis Date   Asthma     Past Surgical History:  Procedure Laterality Date   APPENDECTOMY     hesterectomy      MASTECTOMY Bilateral       Current Outpatient Medications:    B Complex Vitamins (B COMPLEX PO), Take by mouth daily., Disp: , Rfl:    BIOTIN PO, Take 1 capsule by mouth daily., Disp: , Rfl:    Calcium Carbonate (CALCIUM 600 PO), Take 1 tablet by mouth 2 (two) times daily., Disp: , Rfl:    Estradiol 10 MCG TABS vaginal tablet, Place 10 mcg vaginally 2 (two) times a week., Disp: , Rfl:    MAGNESIUM PO, Take by mouth daily., Disp: , Rfl:    OVER THE COUNTER MEDICATION, Take 1 capsule by mouth daily., Disp: , Rfl:    Respiratory Therapy Supplies (FLUTTER) DEVI, USE 3-4 TIMES DAILY AS NEEDED., Disp: 1 each, Rfl: 0   sodium chloride HYPERTONIC 3 % nebulizer solution, Take by nebulization daily., Disp: 750 mL, Rfl: 12   albuterol (VENTOLIN HFA) 108 (90 Base) MCG/ACT inhaler, Inhale 2 puffs into the lungs every 6 (six) hours as needed for wheezing or shortness of breath (chest tightness)., Disp: 1 each, Rfl: 6   Objective:   Vitals:   07/31/22 1340  BP: (!) 178/115  Pulse: 87  SpO2: 98%    Physical Exam Vitals reviewed.  Constitutional:      Appearance: Normal appearance.  HENT:     Head: Normocephalic and atraumatic.  Cardiovascular:     Rate  and Rhythm: Normal rate.     Pulses: Normal pulses.  Pulmonary:     Effort: Pulmonary effort is normal. No respiratory distress.  Abdominal:     General: There is no distension.     Palpations: Abdomen is soft.     Tenderness: There is no abdominal tenderness.  Neurological:     Mental Status: She is alert and oriented to person, place, and time.  Psychiatric:        Mood and Affect: Mood normal.        Behavior: Behavior normal.        Thought Content: Thought content normal.        Judgment: Judgment normal.     Assessment & Plan:  Acquired absence of bilateral breasts and nipples  History of breast cancer  The patient is a very lovely lady who is a candidate for removal and replacement of the implants.  I agree with saline  implants if she gets them replaced.  We talked about the pluses and minuses of the options between not having them replaced and having new ones put in.  This is something she has been thinking about for several years.  She is going to talk with her niece and think about it and we will do a telemetry visit in 2 weeks to discuss it further.  If she wants then we will plan to remove the implants and possibly replace them with slightly smaller saline implants.  Pictures were obtained of the patient and placed in the chart with the patient's or guardian's permission.'  Loel Lofty Loyal Rudy, DO

## 2022-08-01 ENCOUNTER — Institutional Professional Consult (permissible substitution): Payer: Medicare Other | Admitting: Plastic Surgery

## 2022-08-08 ENCOUNTER — Institutional Professional Consult (permissible substitution): Payer: Medicare Other | Admitting: Plastic Surgery

## 2022-08-19 ENCOUNTER — Encounter: Payer: Self-pay | Admitting: Plastic Surgery

## 2022-08-19 ENCOUNTER — Ambulatory Visit (INDEPENDENT_AMBULATORY_CARE_PROVIDER_SITE_OTHER): Payer: Medicare Other | Admitting: Plastic Surgery

## 2022-08-19 DIAGNOSIS — K224 Dyskinesia of esophagus: Secondary | ICD-10-CM

## 2022-08-19 DIAGNOSIS — Z853 Personal history of malignant neoplasm of breast: Secondary | ICD-10-CM

## 2022-08-19 DIAGNOSIS — K219 Gastro-esophageal reflux disease without esophagitis: Secondary | ICD-10-CM

## 2022-08-19 DIAGNOSIS — Z9013 Acquired absence of bilateral breasts and nipples: Secondary | ICD-10-CM

## 2022-08-19 NOTE — Progress Notes (Signed)
   Subjective:    Patient ID: Kristy [name], female    DOB: 1941/04/01, 81 y.o.   MRN: [id number]  The patient is an 81 year old female joining me by phone for further discussion about her breasts.  She underwent breast reconstruction several times.  The first was due to left breast cancer in 1985.  She had surgery as well in [place] and switched between silicone and saline implants.  He also had several contracture release surgeries.  5 feet 3 inches tall and weighs 116 pounds.  Dr. Dessie Coma was her surgeon.  The patient complains of the left breast being more firm than the right and likely due to capsular contractures.  She has not had radiation or chemotherapy.  She is happy with the size and even willing to be slightly smaller.  Her niece will be helping her with transportation.  Patient would like to move ahead with removal of the implants and placement of saline implants.     Review of Systems  Constitutional: Negative.   HENT: Negative.    Eyes: Negative.   Respiratory: Negative.  Negative for chest tightness.   Cardiovascular: Negative.  Negative for leg swelling.  Gastrointestinal: Negative.   Endocrine: Negative.   Genitourinary: Negative.   Musculoskeletal: Negative.        Objective:   Physical Exam        Assessment & Plan:     ICD-10-CM   1. History of breast cancer  Z85.3     2. Acquired absence of bilateral breasts and nipples  Z90.13     3. 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 Esophageal dysmotility  K22.4        I connected with  Kristy [name] on 08/19/22 by phone and verified that I am speaking with the correct person using two identifiers.  We spent 5 minutes in discussion.  The patient was at home and I was at the office.  Urecholine was the medication she was given after surgery to make sure she did not have urinary retention.  She is asked for this again.  Would like to move ahead with removal of current implants and  capsulectomy with placement of saline implants.  We will plan on her spending the night.  I discussed the limitations of evaluation and management by telemedicine. The patient expressed understanding and agreed to proceed.

## 2022-09-20 ENCOUNTER — Telehealth: Payer: Self-pay | Admitting: *Deleted

## 2022-09-20 NOTE — Telephone Encounter (Signed)
Patient responded to Mychart message offering surgery dates, states not planning to do surgery at this time.

## 2022-12-22 DIAGNOSIS — E782 Mixed hyperlipidemia: Secondary | ICD-10-CM | POA: Diagnosis not present

## 2022-12-22 DIAGNOSIS — Z79899 Other long term (current) drug therapy: Secondary | ICD-10-CM | POA: Diagnosis not present

## 2022-12-29 DIAGNOSIS — E782 Mixed hyperlipidemia: Secondary | ICD-10-CM | POA: Diagnosis not present

## 2022-12-29 DIAGNOSIS — Z6820 Body mass index (BMI) 20.0-20.9, adult: Secondary | ICD-10-CM | POA: Diagnosis not present

## 2022-12-29 DIAGNOSIS — Z1331 Encounter for screening for depression: Secondary | ICD-10-CM | POA: Diagnosis not present

## 2022-12-29 DIAGNOSIS — Z0000 Encounter for general adult medical examination without abnormal findings: Secondary | ICD-10-CM | POA: Diagnosis not present

## 2023-03-05 DIAGNOSIS — I10 Essential (primary) hypertension: Secondary | ICD-10-CM | POA: Diagnosis not present

## 2023-03-05 DIAGNOSIS — Z6820 Body mass index (BMI) 20.0-20.9, adult: Secondary | ICD-10-CM | POA: Diagnosis not present

## 2023-05-19 DIAGNOSIS — Z961 Presence of intraocular lens: Secondary | ICD-10-CM | POA: Diagnosis not present

## 2023-05-19 DIAGNOSIS — H43811 Vitreous degeneration, right eye: Secondary | ICD-10-CM | POA: Diagnosis not present

## 2023-05-19 DIAGNOSIS — H52223 Regular astigmatism, bilateral: Secondary | ICD-10-CM | POA: Diagnosis not present

## 2023-05-19 DIAGNOSIS — H40013 Open angle with borderline findings, low risk, bilateral: Secondary | ICD-10-CM | POA: Diagnosis not present

## 2023-05-19 DIAGNOSIS — H31091 Other chorioretinal scars, right eye: Secondary | ICD-10-CM | POA: Diagnosis not present

## 2023-05-19 DIAGNOSIS — H524 Presbyopia: Secondary | ICD-10-CM | POA: Diagnosis not present

## 2023-05-19 DIAGNOSIS — H35372 Puckering of macula, left eye: Secondary | ICD-10-CM | POA: Diagnosis not present

## 2023-06-17 ENCOUNTER — Encounter: Payer: Self-pay | Admitting: Pulmonary Disease

## 2023-06-17 ENCOUNTER — Ambulatory Visit (INDEPENDENT_AMBULATORY_CARE_PROVIDER_SITE_OTHER): Payer: Medicare Other | Admitting: Pulmonary Disease

## 2023-06-17 VITALS — BP 130/82 | HR 67 | Temp 97.2°F | Ht 63.25 in | Wt 117.4 lb

## 2023-06-17 DIAGNOSIS — J4540 Moderate persistent asthma, uncomplicated: Secondary | ICD-10-CM

## 2023-06-17 DIAGNOSIS — J479 Bronchiectasis, uncomplicated: Secondary | ICD-10-CM | POA: Diagnosis not present

## 2023-06-17 MED ORDER — SODIUM CHLORIDE 3 % IN NEBU
INHALATION_SOLUTION | Freq: Two times a day (BID) | RESPIRATORY_TRACT | 11 refills | Status: DC
Start: 2023-06-17 — End: 2024-06-17

## 2023-06-17 MED ORDER — ALBUTEROL SULFATE HFA 108 (90 BASE) MCG/ACT IN AERS
2.0000 | INHALATION_SPRAY | Freq: Four times a day (QID) | RESPIRATORY_TRACT | 11 refills | Status: DC | PRN
Start: 2023-06-17 — End: 2024-06-17

## 2023-06-17 NOTE — Patient Instructions (Addendum)
Nice to see you again  I refilled the albuterol inhaler and the saline nebulizers  Return to clinic in 1 year or sooner as needed

## 2023-06-17 NOTE — Progress Notes (Signed)
Synopsis: Referred in 2018 for bronchitis by Marylen Ponto, MD.  Previously a patient of Dr. Sherene Sires, Dr. Marchelle Gearing, and Dr. Chestine Spore.  Subjective:   PATIENT ID: Kristy [name] GENDER: female DOB: December 11, 1940, MRN: [id number]  Chief Complaint  Patient presents with   Follow-up    Breathing has been good  ACT 18     Kristy [name] is a 82 y.o. woman with a history of multi lobar bronchiectasis who presents for follow-up.      She continues to do quite well.  No significant exacerbations in the last year.  Using hypertonic saline once daily.  Rare albuterol use.  Mg once a week.  Does find it helps loosen up congestion.  We reviewed vaccine schedules etc.  She plans to get seasonal vaccine soon.  After shared decision-making decided to hold off on the Prevnar 20.  Notably she has completed prior 13 and 23 Valent vaccines including the booster about 5 years ago the 23 valent.      Flowsheet data:  Lab Results  Component Value Date   NITRICOXIDE 26 02/27/2017    Past Medical History:  Diagnosis Date   Asthma      Family History  Problem Relation Age of Onset   Allergic rhinitis Neg Hx    Angioedema Neg Hx    Asthma Neg Hx    Atopy Neg Hx    Eczema Neg Hx    Immunodeficiency Neg Hx    Urticaria Neg Hx      Past Surgical History:  Procedure Laterality Date   APPENDECTOMY     hesterectomy     MASTECTOMY Bilateral     Social History   Socioeconomic History   Marital status: Married    Spouse name: Not on file   Number of children: Not on file   Years of education: Not on file   Highest education level: Not on file  Occupational History   Not on file  Tobacco Use   Smoking status: Never   Smokeless tobacco: Never  Vaping Use   Vaping status: Never Used  Substance and Sexual Activity   Alcohol use: No    Alcohol/week: 0.0 standard drinks of alcohol   Drug use: No   Sexual activity: Not on file  Other Topics Concern   Not on file  Social History Narrative    Not on file   Social Determinants of Health   Financial Resource Strain: Not on file  Food Insecurity: Not on file  Transportation Needs: Not on file  Physical Activity: Not on file  Stress: Not on file  Social Connections: Not on file  Intimate Partner Violence: Not on file     Allergies  Allergen Reactions   Avelox [Moxifloxacin Hcl In Nacl]    Ciprofloxacin    Doxycycline Diarrhea   Floxin [Ofloxacin]    Iohexol      Code: HIVES, Desc: PER [facility] @ DR SOOD OFFICE, 05/04/07 (PT IS ALLERGIC TO CONTRAST) CHANGE TO W/O CM. RM, Onset Date: [id number]    Levaquin [Levofloxacin In D5w]    Maxaquin [Lomefloxacin]    Noroxin [Norfloxacin]    Quinolones    Tequin [Gatifloxacin]    Trovan [Alatrofloxacin]    Zagam [Sparfloxacin]      Immunization History  Administered Date(s) Administered   Fluad Quad(high Dose 65+) 07/16/2020   Influenza, High Dose Seasonal PF 07/22/2017, 07/09/2018, 07/24/2019   Moderna Sars-Covid-2 Vaccination 11/30/2019, 12/02/2019   Pneumococcal Conjugate-13 10/08/2014   Pneumococcal Polysaccharide-23 10/20/2007,  05/19/2012    Outpatient Medications Prior to Visit  Medication Sig Dispense Refill   B Complex Vitamins (B COMPLEX PO) Take by mouth daily.     BIOTIN PO Take 1 capsule by mouth daily.     Calcium Carbonate (CALCIUM 600 PO) Take 1 tablet by mouth 2 (two) times daily.     Estradiol 10 MCG TABS vaginal tablet Place 10 mcg vaginally 2 (two) times a week.     olmesartan (BENICAR) 5 MG tablet Take 5 mg by mouth daily.     OVER THE COUNTER MEDICATION Take 1 capsule by mouth daily.     Respiratory Therapy Supplies (FLUTTER) DEVI USE 3-4 TIMES DAILY AS NEEDED. 1 each 0   rosuvastatin (CRESTOR) 5 MG tablet Take 5 mg by mouth 3 (three) times a week.     albuterol (VENTOLIN HFA) 108 (90 Base) MCG/ACT inhaler Inhale 2 puffs into the lungs every 6 (six) hours as needed for wheezing or shortness of breath (chest tightness). 1 each 6   MAGNESIUM PO Take by  mouth daily.     sodium chloride HYPERTONIC 3 % nebulizer solution Take by nebulization daily. 750 mL 12   No facility-administered medications prior to visit.    Review of Systems  Constitutional:  Negative for chills, fever, malaise/fatigue and weight loss.  HENT:  Negative for congestion.   Cardiovascular:  Negative for chest pain and leg swelling.  Gastrointestinal:  Negative for nausea and vomiting.  Endo/Heme/Allergies:  Positive for environmental allergies.     Objective:   Vitals:   06/17/23 1015  BP: 130/82  Pulse: 67  Temp: (!) 97.2 F (36.2 C)  TempSrc: Temporal  SpO2: 97%  Weight: 117 lb 6.4 oz (53.3 kg)  Height: 5' 3.25" (1.607 m)   97% on  RA BMI Readings from Last 3 Encounters:  06/17/23 20.63 kg/m  07/31/22 20.69 kg/m  06/02/22 20.31 kg/m   Wt Readings from Last 3 Encounters:  06/17/23 117 lb 6.4 oz (53.3 kg)  07/31/22 116 lb 12.8 oz (53 kg)  06/02/22 117 lb 6.4 oz (53.3 kg)    Physical Exam Vitals reviewed.  Constitutional:      General: She is not in acute distress.    Appearance: She is not ill-appearing.  HENT:     Head: Normocephalic and atraumatic.  Eyes:     General: No scleral icterus. Cardiovascular:     Rate and Rhythm: Normal rate and regular rhythm.     Heart sounds: No murmur heard. Pulmonary:     Comments: Scattered inspiratory squeaks and rales in the bases, otherwise clear to auscultation.  Speaking in full sentences, no conversational dyspnea.  No observed coughing. Abdominal:     General: There is no distension.     Palpations: Abdomen is soft.     Tenderness: There is no abdominal tenderness.  Musculoskeletal:        General: No deformity.     Cervical back: Neck supple.  Lymphadenopathy:     Cervical: No cervical adenopathy.  Skin:    General: Skin is warm and dry.     Findings: No rash.  Neurological:     General: No focal deficit present.     Mental Status: She is alert.     Coordination: Coordination  normal.  Psychiatric:        Mood and Affect: Mood normal.        Behavior: Behavior normal.      CBC    Component Value Date/Time  WBC 8.1 02/27/2017 1240   RBC 5.57 (H) 02/27/2017 1240   HGB 15.9 (H) 02/27/2017 1240   HCT 47.8 (H) 02/27/2017 1240   PLT 318.0 02/27/2017 1240   MCV 85.8 02/27/2017 1240   MCHC 33.2 02/27/2017 1240   RDW 13.9 02/27/2017 1240   LYMPHSABS 2.0 02/27/2017 1240   MONOABS 0.7 02/27/2017 1240   EOSABS 0.1 02/27/2017 1240   BASOSABS 0.1 02/27/2017 1240    CHEMISTRY No results for input(s): "NA", "K", "CL", "CO2", "GLUCOSE", "BUN", "CREATININE", "CALCIUM", "MG", "PHOS" in the last 168 hours. CrCl cannot be calculated (No successful lab value found.).  2019 labs: IgM 181 IgG 980 IgA 168 Alpha 1 antitrypsin 158, PI*MM  Anti-SSA, SSB negative Anti-SCL 70 negative Antinuclear antibody negative Anti ds DNA antibody negative  Allergy testing 02/2017: + Cats, grasses, ragweed  Micro:  04/04/2020 AFB-negative  Chest Imaging- films reviewed: CT chest 05/11/2018-bronchiectasis most notable in right upper, right middle, lingula, and minimally left upper lobe.  Scattered tree-in-bud opacities.  Likely mucus infections in lingula and RML.  Scattered tree-in-bud opacities in dependent lower lobes.  Pulmonary Functions Testing Results:    Latest Ref Rng & Units 01/27/2018    9:40 AM  PFT Results  FVC-Pre L 1.68   FVC-Predicted Pre % 62   FVC-Post L 1.62   FVC-Predicted Post % 59   Pre FEV1/FVC % % 78   Post FEV1/FCV % % 81   FEV1-Pre L 1.31   FEV1-Predicted Pre % 64   FEV1-Post L 1.30   DLCO uncorrected ml/min/mmHg 18.85   DLCO UNC% % 78   DLVA Predicted % 126   TLC L 4.02   TLC % Predicted % 80   RV % Predicted % 100    No significant obstruction or bronchodilator reversibility.  No significant restriction, air trapping, or hyperinflation.  Diffusion mildly impaired.  Flow volume loop supports mixed restriction obstruction with improvement of  obstruction postbronchodilator.       Assessment & Plan:   No diagnosis found.   Multi- lobar bronchiectasis, most prominent in right middle lobe and lingula- very stable recently. -Continue hypertonic saline (refilled today) and flutter valve every daily -She should increase this to twice daily if she has any deterioration in her pulmonary symptoms. -Continue regular physical activity to maintain exercise tolerance -Up-to-date on pneumonia, flu, Covid vaccines, recommend COVID booster as well as far as RSV vaccine  Asthma: Well controlled. No controller medications.  Anoro in the past caused bowel upset, constipation.  Albuterol as needed.   RTC in 12 months with Dr. Judeth Horn.    Current Outpatient Medications:    B Complex Vitamins (B COMPLEX PO), Take by mouth daily., Disp: , Rfl:    BIOTIN PO, Take 1 capsule by mouth daily., Disp: , Rfl:    Calcium Carbonate (CALCIUM 600 PO), Take 1 tablet by mouth 2 (two) times daily., Disp: , Rfl:    Estradiol 10 MCG TABS vaginal tablet, Place 10 mcg vaginally 2 (two) times a week., Disp: , Rfl:    olmesartan (BENICAR) 5 MG tablet, Take 5 mg by mouth daily., Disp: , Rfl:    OVER THE COUNTER MEDICATION, Take 1 capsule by mouth daily., Disp: , Rfl:    Respiratory Therapy Supplies (FLUTTER) DEVI, USE 3-4 TIMES DAILY AS NEEDED., Disp: 1 each, Rfl: 0   rosuvastatin (CRESTOR) 5 MG tablet, Take 5 mg by mouth 3 (three) times a week., Disp: , Rfl:    albuterol (VENTOLIN HFA) 108 (90 Base)  MCG/ACT inhaler, Inhale 2 puffs into the lungs every 6 (six) hours as needed for wheezing or shortness of breath (chest tightness, congestion)., Disp: 1 each, Rfl: 11   sodium chloride HYPERTONIC 3 % nebulizer solution, Take by nebulization 2 (two) times daily., Disp: 360 mL, Rfl: 11    Karren Burly, MD [facility] 06/17/2023 10:53 AM

## 2023-07-07 DIAGNOSIS — Z79899 Other long term (current) drug therapy: Secondary | ICD-10-CM | POA: Diagnosis not present

## 2023-07-14 DIAGNOSIS — Z6820 Body mass index (BMI) 20.0-20.9, adult: Secondary | ICD-10-CM | POA: Diagnosis not present

## 2023-07-14 DIAGNOSIS — Z23 Encounter for immunization: Secondary | ICD-10-CM | POA: Diagnosis not present

## 2023-07-14 DIAGNOSIS — I10 Essential (primary) hypertension: Secondary | ICD-10-CM | POA: Diagnosis not present

## 2023-07-27 DIAGNOSIS — Z23 Encounter for immunization: Secondary | ICD-10-CM | POA: Diagnosis not present

## 2024-01-05 DIAGNOSIS — D692 Other nonthrombocytopenic purpura: Secondary | ICD-10-CM | POA: Diagnosis not present

## 2024-01-05 DIAGNOSIS — D1801 Hemangioma of skin and subcutaneous tissue: Secondary | ICD-10-CM | POA: Diagnosis not present

## 2024-01-05 DIAGNOSIS — D2272 Melanocytic nevi of left lower limb, including hip: Secondary | ICD-10-CM | POA: Diagnosis not present

## 2024-01-05 DIAGNOSIS — Z85820 Personal history of malignant melanoma of skin: Secondary | ICD-10-CM | POA: Diagnosis not present

## 2024-01-05 DIAGNOSIS — L821 Other seborrheic keratosis: Secondary | ICD-10-CM | POA: Diagnosis not present

## 2024-01-08 DIAGNOSIS — E782 Mixed hyperlipidemia: Secondary | ICD-10-CM | POA: Diagnosis not present

## 2024-01-08 DIAGNOSIS — Z79899 Other long term (current) drug therapy: Secondary | ICD-10-CM | POA: Diagnosis not present

## 2024-01-18 DIAGNOSIS — Z1331 Encounter for screening for depression: Secondary | ICD-10-CM | POA: Diagnosis not present

## 2024-01-18 DIAGNOSIS — Z1339 Encounter for screening examination for other mental health and behavioral disorders: Secondary | ICD-10-CM | POA: Diagnosis not present

## 2024-01-18 DIAGNOSIS — Z0000 Encounter for general adult medical examination without abnormal findings: Secondary | ICD-10-CM | POA: Diagnosis not present

## 2024-01-18 DIAGNOSIS — E782 Mixed hyperlipidemia: Secondary | ICD-10-CM | POA: Diagnosis not present

## 2024-02-15 DIAGNOSIS — K5732 Diverticulitis of large intestine without perforation or abscess without bleeding: Secondary | ICD-10-CM | POA: Diagnosis not present

## 2024-02-15 DIAGNOSIS — Z6820 Body mass index (BMI) 20.0-20.9, adult: Secondary | ICD-10-CM | POA: Diagnosis not present

## 2024-02-23 DIAGNOSIS — D485 Neoplasm of uncertain behavior of skin: Secondary | ICD-10-CM | POA: Diagnosis not present

## 2024-02-23 DIAGNOSIS — L820 Inflamed seborrheic keratosis: Secondary | ICD-10-CM | POA: Diagnosis not present

## 2024-02-23 DIAGNOSIS — Z85820 Personal history of malignant melanoma of skin: Secondary | ICD-10-CM | POA: Diagnosis not present

## 2024-05-25 DIAGNOSIS — Z961 Presence of intraocular lens: Secondary | ICD-10-CM | POA: Diagnosis not present

## 2024-05-25 DIAGNOSIS — H524 Presbyopia: Secondary | ICD-10-CM | POA: Diagnosis not present

## 2024-06-08 DIAGNOSIS — H5213 Myopia, bilateral: Secondary | ICD-10-CM | POA: Diagnosis not present

## 2024-06-17 ENCOUNTER — Encounter: Payer: Self-pay | Admitting: Pulmonary Disease

## 2024-06-17 ENCOUNTER — Other Ambulatory Visit: Payer: Self-pay | Admitting: Pulmonary Disease

## 2024-06-17 ENCOUNTER — Ambulatory Visit: Admitting: Pulmonary Disease

## 2024-06-17 VITALS — BP 116/70 | HR 68 | Wt 117.0 lb

## 2024-06-17 DIAGNOSIS — J479 Bronchiectasis, uncomplicated: Secondary | ICD-10-CM

## 2024-06-17 DIAGNOSIS — K219 Gastro-esophageal reflux disease without esophagitis: Secondary | ICD-10-CM

## 2024-06-17 DIAGNOSIS — J45909 Unspecified asthma, uncomplicated: Secondary | ICD-10-CM | POA: Diagnosis not present

## 2024-06-17 DIAGNOSIS — R918 Other nonspecific abnormal finding of lung field: Secondary | ICD-10-CM

## 2024-06-17 MED ORDER — SODIUM CHLORIDE 3 % IN NEBU: INHALATION_SOLUTION | Freq: Two times a day (BID) | RESPIRATORY_TRACT | 11 refills | Status: AC

## 2024-06-17 MED ORDER — PROAIR RESPICLICK 108 (90 BASE) MCG/ACT IN AEPB
2.0000 | INHALATION_SPRAY | Freq: Four times a day (QID) | RESPIRATORY_TRACT | 12 refills | Status: DC | PRN
Start: 2024-06-17 — End: 2024-06-17

## 2024-06-17 MED ORDER — STIOLTO RESPIMAT 2.5-2.5 MCG/ACT IN AERS: INHALATION_SPRAY | RESPIRATORY_TRACT | Status: DC

## 2024-06-17 NOTE — Patient Instructions (Signed)
 Nice to see you again  I refilled the ProAir   I refilled the saline nebulizer solution  Try Stiolto 2 puffs once a day.  If this does not help much just continue the ProAir  as you are.  You can continue use the ProAir  as needed in addition to the Stiolto.  Return to clinic in 1 year or sooner as needed

## 2024-06-17 NOTE — Progress Notes (Signed)
 Synopsis: Referred in 2018 for bronchitis by Ina Marcellus RAMAN, MD.  Previously a patient of Dr. Darlean, Dr. Geronimo, and Dr. Gretta.  Subjective:   PATIENT ID: Kristy [name] GENDER: female DOB: [birth year], MRN: [id number]  Chief Complaint  Patient presents with   Medical Management of Chronic Issues    Pt states Inh hurts stomach can she try something else     Mrs. Puccini is a 83 y.o. woman with a history of multi lobar bronchiectasis who presents for follow-up.      She continues to do quite well.  No significant exacerbations in the last year.  Using hypertonic saline once daily. Using albuterol  more frequently.  Does find it helps loosen up congestion.  We discussed trying a different daily inhaler, Stiolto.  In the past Anoro and Symbicort caused adverse reactions, Anoro stomach upset, Symbicort caused hair loss.      Flowsheet data:  Lab Results  Component Value Date   NITRICOXIDE 26 02/27/2017    Past Medical History:  Diagnosis Date   Asthma      Family History  Problem Relation Age of Onset   Allergic rhinitis Neg Hx    Angioedema Neg Hx    Asthma Neg Hx    Atopy Neg Hx    Eczema Neg Hx    Immunodeficiency Neg Hx    Urticaria Neg Hx      Past Surgical History:  Procedure Laterality Date   APPENDECTOMY     hesterectomy     MASTECTOMY Bilateral     Social History   Socioeconomic History   Marital status: Married    Spouse name: Not on file   Number of children: Not on file   Years of education: Not on file   Highest education level: Not on file  Occupational History   Not on file  Tobacco Use   Smoking status: Never   Smokeless tobacco: Never  Vaping Use   Vaping status: Never Used  Substance and Sexual Activity   Alcohol use: No    Alcohol/week: 0.0 standard drinks of alcohol   Drug use: No   Sexual activity: Not on file  Other Topics Concern   Not on file  Social History Narrative   Not on file   Social Drivers of Health    Financial Resource Strain: Not on file  Food Insecurity: Not on file  Transportation Needs: Not on file  Physical Activity: Not on file  Stress: Not on file  Social Connections: Not on file  Intimate Partner Violence: Not on file     Allergies  Allergen Reactions   Avelox [Moxifloxacin Hcl In Nacl]    Ciprofloxacin    Doxycycline Diarrhea   Floxin [Ofloxacin]    Iohexol      Code: HIVES, Desc: PER [facility] @ DR SOOD OFFICE, 05/04/07 (PT IS ALLERGIC TO CONTRAST) CHANGE TO W/O CM. RM, Onset Date: [id number]    Levaquin [Levofloxacin In D5w]    Maxaquin [Lomefloxacin]    Noroxin [Norfloxacin]    Quinolones    Tequin [Gatifloxacin]    Trovan [Alatrofloxacin]    Zagam [Sparfloxacin]      Immunization History  Administered Date(s) Administered   Fluad Quad(high Dose 65+) 07/16/2020   INFLUENZA, HIGH DOSE SEASONAL PF 07/22/2017, 07/09/2018, 07/24/2019   Moderna Sars-Covid-2 Vaccination 11/30/2019, 12/02/2019   Pneumococcal Conjugate-13 10/08/2014   Pneumococcal Polysaccharide-23 10/20/2007, 05/19/2012    Outpatient Medications Prior to Visit  Medication Sig Dispense Refill   albuterol  (VENTOLIN  HFA)  108 (90 Base) MCG/ACT inhaler Inhale 2 puffs into the lungs every 6 (six) hours as needed for wheezing or shortness of breath (chest tightness, congestion). 1 each 11   B Complex Vitamins (B COMPLEX PO) Take by mouth daily.     BIOTIN PO Take 1 capsule by mouth daily.     Calcium Carbonate (CALCIUM 600 PO) Take 1 tablet by mouth 2 (two) times daily.     Estradiol 10 MCG TABS vaginal tablet Place 10 mcg vaginally 2 (two) times a week.     olmesartan (BENICAR) 5 MG tablet Take 5 mg by mouth daily.     OVER THE COUNTER MEDICATION Take 1 capsule by mouth daily.     Respiratory Therapy Supplies (FLUTTER) DEVI USE 3-4 TIMES DAILY AS NEEDED. 1 each 0   rosuvastatin (CRESTOR) 5 MG tablet Take 5 mg by mouth 3 (three) times a week.     sodium chloride  HYPERTONIC 3 % nebulizer solution  Take by nebulization 2 (two) times daily. 360 mL 11   No facility-administered medications prior to visit.    Review of systems: N/A   Objective:   Vitals:   06/17/24 1127  BP: 116/70  Pulse: 68  SpO2: 98%  Weight: 117 lb (53.1 kg)   98% on  RA BMI Readings from Last 3 Encounters:  06/17/24 20.56 kg/m  06/17/23 20.63 kg/m  07/31/22 20.69 kg/m   Wt Readings from Last 3 Encounters:  06/17/24 117 lb (53.1 kg)  06/17/23 117 lb 6.4 oz (53.3 kg)  07/31/22 116 lb 12.8 oz (53 kg)    Physical Exam Vitals reviewed.  Constitutional:      General: She is not in acute distress.    Appearance: She is not ill-appearing.  HENT:     Head: Normocephalic and atraumatic.  Eyes:     General: No scleral icterus. Cardiovascular:     Rate and Rhythm: Normal rate and regular rhythm.     Heart sounds: No murmur heard. Pulmonary:     Comments: Scattered inspiratory squeaks and rales in the bases, otherwise clear to auscultation.  Speaking in full sentences, no conversational dyspnea.  No observed coughing. Abdominal:     General: There is no distension.     Palpations: Abdomen is soft.     Tenderness: There is no abdominal tenderness.  Musculoskeletal:        General: No deformity.     Cervical back: Neck supple.  Lymphadenopathy:     Cervical: No cervical adenopathy.  Skin:    General: Skin is warm and dry.     Findings: No rash.  Neurological:     General: No focal deficit present.     Mental Status: She is alert.     Coordination: Coordination normal.  Psychiatric:        Mood and Affect: Mood normal.        Behavior: Behavior normal.      CBC    Component Value Date/Time   WBC 8.1 02/27/2017 1240   RBC 5.57 (H) 02/27/2017 1240   HGB 15.9 (H) 02/27/2017 1240   HCT 47.8 (H) 02/27/2017 1240   PLT 318.0 02/27/2017 1240   MCV 85.8 02/27/2017 1240   MCHC 33.2 02/27/2017 1240   RDW 13.9 02/27/2017 1240   LYMPHSABS 2.0 02/27/2017 1240   MONOABS 0.7 02/27/2017 1240    EOSABS 0.1 02/27/2017 1240   BASOSABS 0.1 02/27/2017 1240    CHEMISTRY No results for input(s): NA, K, CL, CO2, GLUCOSE, BUN,  CREATININE, CALCIUM, MG, PHOS in the last 168 hours. CrCl cannot be calculated (No successful lab value found.).  2019 labs: IgM 181 IgG 980 IgA 168 Alpha 1 antitrypsin 158, PI*MM  Anti-SSA, SSB negative Anti-SCL 70 negative Antinuclear antibody negative Anti ds DNA antibody negative  Allergy  testing 02/2017: + Cats, grasses, ragweed  Micro:  04/04/2020 AFB-negative  Chest Imaging- films reviewed: CT chest 05/11/2018-bronchiectasis most notable in right upper, right middle, lingula, and minimally left upper lobe.  Scattered tree-in-bud opacities.  Likely mucus infections in lingula and RML.  Scattered tree-in-bud opacities in dependent lower lobes.  Pulmonary Functions Testing Results:    Latest Ref Rng & Units 01/27/2018    9:40 AM  PFT Results  FVC-Pre L 1.68   FVC-Predicted Pre % 62   FVC-Post L 1.62   FVC-Predicted Post % 59   Pre FEV1/FVC % % 78   Post FEV1/FCV % % 81   FEV1-Pre L 1.31   FEV1-Predicted Pre % 64   FEV1-Post L 1.30   DLCO uncorrected ml/min/mmHg 18.85   DLCO UNC% % 78   DLVA Predicted % 126   TLC L 4.02   TLC % Predicted % 80   RV % Predicted % 100    No significant obstruction or bronchodilator reversibility.  No significant restriction, air trapping, or hyperinflation.  Diffusion mildly impaired.  Flow volume loop supports mixed restriction obstruction with improvement of obstruction postbronchodilator.       Assessment & Plan:     ICD-10-CM   1. Uncomplicated asthma, unspecified asthma severity, unspecified whether persistent  J45.909     2. Laryngopharyngeal reflux (LPR)  K21.9     3. Multiple pulmonary nodules  R91.8        Multi- lobar bronchiectasis, most prominent in right middle lobe and lingula- very stable recently. -Continue hypertonic saline (refilled today) and flutter valve  (new order for this today) daily -She should increase this to twice daily if she has any deterioration in her pulmonary symptoms. -Continue regular physical activity to maintain exercise tolerance  Asthma: Well controlled.  Albuterol  use a bit more than prior.  Start Stiolto.  Avoid Anoro, stomach upset and Symbicort, hair loss.  Continue albuterol  as needed, refilled today.   RTC in 12 months with Dr. Annella.    Current Outpatient Medications:    albuterol  (VENTOLIN  HFA) 108 (90 Base) MCG/ACT inhaler, Inhale 2 puffs into the lungs every 6 (six) hours as needed for wheezing or shortness of breath (chest tightness, congestion)., Disp: 1 each, Rfl: 11   B Complex Vitamins (B COMPLEX PO), Take by mouth daily., Disp: , Rfl:    BIOTIN PO, Take 1 capsule by mouth daily., Disp: , Rfl:    Calcium Carbonate (CALCIUM 600 PO), Take 1 tablet by mouth 2 (two) times daily., Disp: , Rfl:    Estradiol 10 MCG TABS vaginal tablet, Place 10 mcg vaginally 2 (two) times a week., Disp: , Rfl:    olmesartan (BENICAR) 5 MG tablet, Take 5 mg by mouth daily., Disp: , Rfl:    OVER THE COUNTER MEDICATION, Take 1 capsule by mouth daily., Disp: , Rfl:    Respiratory Therapy Supplies (FLUTTER) DEVI, USE 3-4 TIMES DAILY AS NEEDED., Disp: 1 each, Rfl: 0   rosuvastatin (CRESTOR) 5 MG tablet, Take 5 mg by mouth 3 (three) times a week., Disp: , Rfl:    sodium chloride  HYPERTONIC 3 % nebulizer solution, Take by nebulization 2 (two) times daily., Disp: 360 mL, Rfl: 11  Donnice JONELLE Beals, MD [facility] 06/17/2024 11:43 AM

## 2024-06-17 NOTE — Addendum Note (Signed)
 Addended by: ARMAND SOR R on: 06/17/2024 12:29 PM   Modules accepted: Orders

## 2024-07-12 DIAGNOSIS — K5732 Diverticulitis of large intestine without perforation or abscess without bleeding: Secondary | ICD-10-CM | POA: Diagnosis not present

## 2024-07-12 DIAGNOSIS — Z6820 Body mass index (BMI) 20.0-20.9, adult: Secondary | ICD-10-CM | POA: Diagnosis not present

## 2024-07-20 DIAGNOSIS — Z131 Encounter for screening for diabetes mellitus: Secondary | ICD-10-CM | POA: Diagnosis not present

## 2024-07-20 DIAGNOSIS — E782 Mixed hyperlipidemia: Secondary | ICD-10-CM | POA: Diagnosis not present

## 2024-07-27 DIAGNOSIS — L309 Dermatitis, unspecified: Secondary | ICD-10-CM | POA: Diagnosis not present

## 2024-07-27 DIAGNOSIS — Z23 Encounter for immunization: Secondary | ICD-10-CM | POA: Diagnosis not present

## 2024-07-27 DIAGNOSIS — I10 Essential (primary) hypertension: Secondary | ICD-10-CM | POA: Diagnosis not present

## 2024-07-27 DIAGNOSIS — Z6820 Body mass index (BMI) 20.0-20.9, adult: Secondary | ICD-10-CM | POA: Diagnosis not present

## 2024-08-18 DIAGNOSIS — Z23 Encounter for immunization: Secondary | ICD-10-CM | POA: Diagnosis not present

## 2024-10-17 ENCOUNTER — Other Ambulatory Visit: Payer: Self-pay | Admitting: Pulmonary Disease

## 2024-10-17 NOTE — Telephone Encounter (Unsigned)
 Copied from CRM #[id number]. Topic: Clinical - Medication Refill >> Oct 17, 2024  3:33 PM LaVerne A wrote: Medication: Tiotropium Bromide -Olodaterol (STIOLTO RESPIMAT ) 2.5-2.5 MCG/ACT AERS  Has the patient contacted their pharmacy? No (Agent: If no, request that the patient contact the pharmacy for the refill. If patient does not wish to contact the pharmacy document the reason why and proceed with request.) (Agent: If yes, when and what did the pharmacy advise?)  This is the patient's preferred pharmacy:  [id number] - [place], [place] - [address] AT [facility] [address] [place] [address] Phone: [phone] Fax: [phone]  Is this the correct pharmacy for this prescription? Yes If no, delete pharmacy and type the correct one.   Has the prescription been filled recently? No, only had samples but would like a prescription  Is the patient out of the medication? No  Has the patient been seen for an appointment in the last year OR does the patient have an upcoming appointment? Yes  Can we respond through MyChart? No, please call patient  Agent: Please be advised that Rx refills may take up to 3 business days. We ask that you follow-up with your pharmacy.

## 2024-10-21 ENCOUNTER — Encounter: Payer: Self-pay | Admitting: Pulmonary Disease

## 2024-10-21 ENCOUNTER — Other Ambulatory Visit: Payer: Self-pay | Admitting: Pulmonary Disease

## 2024-10-21 MED ORDER — STIOLTO RESPIMAT 2.5-2.5 MCG/ACT IN AERS: INHALATION_SPRAY | RESPIRATORY_TRACT | 3 refills | Status: AC

## 2024-10-21 NOTE — Telephone Encounter (Signed)
 Stiolto not covered by insurance. Pharmacy is requesting Anoro.  Please advise Dr. Annella

## 2024-10-24 NOTE — Telephone Encounter (Signed)
 Anoro is not acceptable due to adverse events including GI upset. She has only tolerated Stiolto. Can we please pursue a PA for stiolto with this information (also in my progress note). Thanks!

## 2024-10-25 ENCOUNTER — Telehealth: Payer: Self-pay

## 2024-10-25 ENCOUNTER — Other Ambulatory Visit (HOSPITAL_COMMUNITY): Payer: Self-pay

## 2024-10-25 NOTE — Telephone Encounter (Signed)
 STIOLTO RESPIMAT  Aerosol Soln Type of coverage approved: Non-Formulary This approval authorizes your coverage from 10/06/2024 - 10/25/2025

## 2024-10-25 NOTE — Telephone Encounter (Signed)
 PA request has been Received. New Encounter has been or will be created for follow up. For additional info see Pharmacy Prior Auth telephone encounter from 01/20.

## 2024-10-25 NOTE — Telephone Encounter (Signed)
*  Pulm  Pharmacy Patient Advocate Encounter   Received notification from RX Request Messages that prior authorization for Tiotropium Bromide -Olodaterol (STIOLTO RESPIMAT ) 2.5-2.5 MCG/ACT AERS  is required/requested.   Insurance verification completed.   The patient is insured through [employer] [facility].   Per test claim: PA required; PA submitted to above mentioned insurance via Latent Key/confirmation #/EOC [id number] Status is pending

## 2024-10-25 NOTE — Telephone Encounter (Signed)
 Requesting Stiolto PA, as pt is unable to tolerate Anoro.

## 2024-10-26 ENCOUNTER — Encounter: Payer: Self-pay | Admitting: *Deleted
